# Patient Record
Sex: Female | Born: 1995
Health system: Southern US, Community
[De-identification: ages and names within clinical notes are randomized; demographics above are authoritative.]

## PROBLEM LIST (undated history)

## (undated) DIAGNOSIS — A749 Chlamydial infection, unspecified: Secondary | ICD-10-CM

## (undated) DIAGNOSIS — A599 Trichomoniasis, unspecified: Secondary | ICD-10-CM

---
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
## 2016-01-30 ENCOUNTER — Ambulatory Visit (INDEPENDENT_AMBULATORY_CARE_PROVIDER_SITE_OTHER): Payer: Medicaid Other | Admitting: Obstetrics

## 2016-01-30 ENCOUNTER — Encounter: Payer: Self-pay | Admitting: Obstetrics

## 2016-01-30 VITALS — BP 128/82 | HR 95 | Temp 98.6°F | Ht 59.0 in | Wt 157.0 lb

## 2016-01-30 DIAGNOSIS — Z01419 Encounter for gynecological examination (general) (routine) without abnormal findings: Secondary | ICD-10-CM | POA: Diagnosis not present

## 2016-01-30 DIAGNOSIS — Z0000 Encounter for general adult medical examination without abnormal findings: Secondary | ICD-10-CM

## 2016-01-30 DIAGNOSIS — Z975 Presence of (intrauterine) contraceptive device: Secondary | ICD-10-CM

## 2016-01-30 DIAGNOSIS — Z113 Encounter for screening for infections with a predominantly sexual mode of transmission: Secondary | ICD-10-CM

## 2016-01-30 MED ORDER — VITAFOL-NANO 18-0.6-0.4 MG PO TABS
1.0000 | ORAL_TABLET | Freq: Every day | ORAL | Status: DC
Start: 2016-01-30 — End: 2016-11-29

## 2016-01-31 ENCOUNTER — Encounter: Payer: Self-pay | Admitting: Obstetrics

## 2016-01-31 LAB — HIV ANTIBODY (ROUTINE TESTING W REFLEX): HIV Screen 4th Generation wRfx: NONREACTIVE

## 2016-01-31 LAB — HEPATITIS B SURFACE ANTIGEN: HEP B S AG: NEGATIVE

## 2016-01-31 LAB — RPR: RPR: NONREACTIVE

## 2016-01-31 LAB — HEPATITIS C ANTIBODY: Hep C Virus Ab: 0.1 s/co ratio (ref 0.0–0.9)

## 2016-01-31 NOTE — Progress Notes (Signed)
Subjective:        Elizabeth Coleman is a 20 y.o. female here for a routine exam.  Current complaints: none.    Personal health questionnaire:  Is patient [name], have a family history of breast and/or ovarian cancer: no Is there a family history of uterine cancer diagnosed at age < 1350, gastrointestinal cancer, urinary tract cancer, family member who is a Personnel officerLynch syndrome-associated carrier: no Is the patient overweight and hypertensive, family history of diabetes, personal history of gestational diabetes, preeclampsia or PCOS: no Is patient over 7255, have PCOS,  family history of premature CHD under age 19, diabetes, smoke, have hypertension or peripheral artery disease:  no At any time, has a partner hit, kicked or otherwise hurt or frightened you?: no Over the past 2 weeks, have you felt down, depressed or hopeless?: no Over the past 2 weeks, have you felt little interest or pleasure in doing things?:no   Gynecologic History No LMP recorded. Patient has had an implant. Contraception: Nexplanon Last Pap: n/a. Results were: n/a Last mammogram: n/a. Results were: n/a  Obstetric History OB History  Gravida Para Term Preterm AB SAB TAB Ectopic Multiple Living  0 0 0 0 0 0 0 0 0 0         Past Medical History  Diagnosis Date  . Chlamydia   . Trichomonal infection     History reviewed. No pertinent past surgical history.   Current outpatient prescriptions:  .  etonogestrel (NEXPLANON) 68 MG IMPL implant, 1 each by Subdermal route once., Disp: , Rfl:  .  Prenatal-Fe Fum-Methf-FA w/o A (VITAFOL-NANO) 18-0.6-0.4 MG TABS, Take 1 tablet by mouth daily before breakfast., Disp: 30 tablet, Rfl: 11 No Known Allergies  Social History  Substance Use Topics  . Smoking status: Light Tobacco Smoker -- 0.25 packs/day    Types: Cigarettes  . Smokeless tobacco: Never Used     Comment: trying to stop   . Alcohol Use: No    Family History  Problem Relation Age of Onset  .  Hypertension Other   . Diabetes Other   . Cancer Other   . Heart disease Maternal Grandmother   . Cancer Maternal Grandfather       Review of Systems  Constitutional: negative for fatigue and weight loss Respiratory: negative for cough and wheezing Cardiovascular: negative for chest pain, fatigue and palpitations Gastrointestinal: negative for abdominal pain and change in bowel habits Musculoskeletal:negative for myalgias Neurological: negative for gait problems and tremors Behavioral/Psych: negative for abusive relationship, depression Endocrine: negative for temperature intolerance   Genitourinary:negative for abnormal menstrual periods, genital lesions, hot flashes, sexual problems and vaginal discharge Integument/breast: negative for breast lump, breast tenderness, nipple discharge and skin lesion(s)    Objective:       BP 128/82 mmHg  Pulse 95  Temp(Src) 98.6 F (37 C)  Ht 4\' 11"  (1.499 m)  Wt 157 lb (71.215 kg)  BMI 31.69 kg/m2 General:   alert  Skin:   no rash or abnormalities  Lungs:   clear to auscultation bilaterally  Heart:   regular rate and rhythm, S1, S2 normal, no murmur, click, rub or gallop  Breasts:   normal without suspicious masses, skin or nipple changes or axillary nodes  Abdomen:  normal findings: no organomegaly, soft, non-tender and no hernia  Pelvis:  External genitalia: normal general appearance Urinary system: urethral meatus normal and bladder without fullness, nontender Vaginal: normal without tenderness, induration or masses Cervix: normal appearance Adnexa: normal bimanual  exam Uterus: anteverted and non-tender, normal size   Lab Review Urine pregnancy test Labs reviewed yes Radiologic studies reviewed no    Assessment:    Healthy female exam.    Plan:    Education reviewed: depression evaluation, low fat, low cholesterol diet, safe sex/STD prevention and weight bearing exercise. Contraception: condoms and Nexplanon. Follow  up in: 1 year.   Meds ordered this encounter  Medications  . etonogestrel (NEXPLANON) 68 MG IMPL implant    Sig: 1 each by Subdermal route once.  . Prenatal-Fe Fum-Methf-FA w/o A (VITAFOL-NANO) 18-0.6-0.4 MG TABS    Sig: Take 1 tablet by mouth daily before breakfast.    Dispense:  30 tablet    Refill:  11   Orders Placed This Encounter  Procedures  . HIV antibody  . Hepatitis B surface antigen  . RPR  . Hepatitis C antibody  . NuSwab Vaginitis Plus (VG+)

## 2016-02-02 ENCOUNTER — Other Ambulatory Visit: Payer: Self-pay | Admitting: Obstetrics

## 2016-02-02 DIAGNOSIS — N760 Acute vaginitis: Principal | ICD-10-CM

## 2016-02-02 DIAGNOSIS — B9689 Other specified bacterial agents as the cause of diseases classified elsewhere: Secondary | ICD-10-CM

## 2016-02-02 MED ORDER — METRONIDAZOLE 500 MG PO TABS: 500.0000 mg | ORAL_TABLET | Freq: Two times a day (BID) | ORAL | Status: DC

## 2016-02-05 LAB — NUSWAB VAGINITIS PLUS (VG+)
ATOPOBIUM VAGINAE: HIGH {score} — AB
BVAB 2: HIGH {score} — AB
CHLAMYDIA TRACHOMATIS, NAA: NEGATIVE
Candida albicans, NAA: NEGATIVE
Candida glabrata, NAA: NEGATIVE
MEGASPHAERA 1: HIGH {score} — AB
NEISSERIA GONORRHOEAE, NAA: NEGATIVE
Trich vag by NAA: NEGATIVE

## 2016-02-06 ENCOUNTER — Encounter (HOSPITAL_COMMUNITY): Payer: Self-pay

## 2016-02-06 ENCOUNTER — Other Ambulatory Visit: Payer: Self-pay | Admitting: Obstetrics

## 2016-02-06 ENCOUNTER — Emergency Department (HOSPITAL_COMMUNITY)
Admission: EM | Admit: 2016-02-06 | Discharge: 2016-02-06 | Disposition: A | Discharge status: Home / Self Care | Payer: Medicaid Other | Attending: Emergency Medicine | Admitting: Emergency Medicine

## 2016-02-06 DIAGNOSIS — L02415 Cutaneous abscess of right lower limb: Secondary | ICD-10-CM | POA: Diagnosis not present

## 2016-02-06 DIAGNOSIS — Z79899 Other long term (current) drug therapy: Secondary | ICD-10-CM | POA: Diagnosis not present

## 2016-02-06 DIAGNOSIS — Z8619 Personal history of other infectious and parasitic diseases: Secondary | ICD-10-CM | POA: Diagnosis not present

## 2016-02-06 DIAGNOSIS — F17210 Nicotine dependence, cigarettes, uncomplicated: Secondary | ICD-10-CM | POA: Insufficient documentation

## 2016-02-06 DIAGNOSIS — Z792 Long term (current) use of antibiotics: Secondary | ICD-10-CM | POA: Insufficient documentation

## 2016-02-06 DIAGNOSIS — L089 Local infection of the skin and subcutaneous tissue, unspecified: Secondary | ICD-10-CM | POA: Diagnosis present

## 2016-02-06 MED ORDER — SULFAMETHOXAZOLE-TRIMETHOPRIM 800-160 MG PO TABS
1.0000 | ORAL_TABLET | Freq: Once | ORAL | Status: AC
  Administered 2016-02-06: 1 via ORAL
  Filled 2016-02-06: qty 1

## 2016-02-06 MED ORDER — SULFAMETHOXAZOLE-TRIMETHOPRIM 800-160 MG PO TABS: 1.0000 | ORAL_TABLET | Freq: Two times a day (BID) | ORAL | Status: AC

## 2016-02-06 NOTE — ED Notes (Signed)
Pt complains of an abcess on her right inner thigh that she noticed Sunday, it's not draining yet and is as big as a dime

## 2016-02-06 NOTE — Discharge Instructions (Signed)
Please read and follow all provided instructions.  Your diagnoses today include:  1. Abscess of right thigh     Tests performed today include:  Vital signs. See below for your results today.   Medications prescribed:   Bactrim (trimethoprim/sulfamethoxazole) - antibiotic  You have been prescribed an antibiotic medicine: take the entire course of medicine even if you are feeling better. Stopping early can cause the antibiotic not to work.  Take any prescribed medications only as directed.   Home care instructions:   Follow any educational materials contained in this packet  Follow-up instructions: Return to the Emergency Department in 48 hours for a recheck if your symptoms are not significantly improved.  Please follow-up with your primary care provider in the next 1 week for further evaluation of your symptoms.   Return instructions:  Return to the Emergency Department if you have:  Fever  Worsening symptoms  Worsening pain  Worsening swelling  Redness of the skin that moves away from the affected area, especially if it streaks away from the affected area   Any other emergent concerns   Your vital signs today were: BP 99/64 mmHg   Pulse 70   Temp(Src) 98.3 F (36.8 C) (Oral)   Resp 18   Ht 4\' 11"  (1.499 m)   Wt 70.308 kg   BMI 31.29 kg/m2   SpO2 100% If your blood pressure (BP) was elevated above 135/85 this visit, please have this repeated by your doctor within one month. --------------

## 2016-02-06 NOTE — ED Provider Notes (Signed)
CSN: [id number]     Arrival date & time 02/06/16  0213 History   First MD Initiated Contact with Patient 02/06/16 [phone]     Chief Complaint  Patient presents with  . Recurrent Skin Infections     (Consider location/radiation/quality/duration/timing/severity/associated sxs/prior Treatment) HPI Comments: Patient with history of skin infection presents with complaint of abscess to the right thigh first noticed 2 days ago. Area is mildly tender. It has not been draining. No fevers, nausea or vomiting. Patient has never needed I&D in the past and has been treated with oral antibiotics only. No history of diabetes or immunocompromise. The onset of this condition was acute. The course is constant. Aggravating factors: none. Alleviating factors: none.    The history is provided by the patient.    Past Medical History  Diagnosis Date  . Chlamydia   . Trichomonal infection    History reviewed. No pertinent past surgical history. Family History  Problem Relation Age of Onset  . Hypertension Other   . Diabetes Other   . Cancer Other   . Heart disease Maternal Grandmother   . Cancer Maternal Grandfather    Social History  Substance Use Topics  . Smoking status: Light Tobacco Smoker -- 0.25 packs/day    Types: Cigarettes  . Smokeless tobacco: Never Used     Comment: trying to stop   . Alcohol Use: No   OB History    Gravida Para Term Preterm AB TAB SAB Ectopic Multiple Living       Review of Systems  Constitutional: Negative for fever.  Gastrointestinal: Negative for nausea and vomiting.  Skin: Negative for color change.       Positive for abscess.  Hematological: Negative for adenopathy.      Allergies  Review of patient's allergies indicates no known allergies.  Home Medications   Prior to Admission medications   Medication Sig Start Date End Date Taking? Authorizing Provider  etonogestrel (NEXPLANON) 68 MG IMPL implant 1 each by Subdermal route  once.   Yes Historical Provider, MD  metroNIDAZOLE (FLAGYL) 500 MG tablet Take 1 tablet (500 mg total) by mouth 2 (two) times daily. 02/02/16  Yes Brock Bad, MD  Prenatal-Fe Fum-Methf-FA w/o A (VITAFOL-NANO) 18-0.6-0.4 MG TABS Take 1 tablet by mouth daily before breakfast. 01/30/16   Brock Bad, MD   BP 99/64 mmHg  Pulse 70  Temp(Src) 98.3 F (36.8 C) (Oral)  Resp 18  Ht  (1.499 m)  Wt 70.308 kg  BMI 31.29 kg/m2  SpO2 100% Physical Exam  Constitutional: She appears well-developed and well-nourished.  HENT:  Head: Normocephalic and atraumatic.  Eyes: Conjunctivae are normal.  Neck: Normal range of motion. Neck supple.  Pulmonary/Chest: No respiratory distress.  Neurological: She is alert.  Skin: Skin is warm and dry.  Small pustule without significant area of fluctuance or induration to the right inner aspect of the thigh. No surrounding cellulitis.  Psychiatric: She has a normal mood and affect.  Nursing note and vitals reviewed.   ED Course  Procedures (including critical care time) Labs Review Labs Reviewed - No data to display  Imaging Review No results found. I have personally reviewed and evaluated these images and lab results as part of my medical decision-making.   EKG Interpretation None       4:49 AM Patient seen and examined. Medications ordered.   Vital signs reviewed and are as follows: BP 99/64 mmHg  Pulse 70  Temp(Src) 98.3 F (36.8 C) (Oral)  Resp 18  Ht 4\' 11"  (1.499 m)  Wt 70.308 kg  BMI 31.29 kg/m2  SpO2 100%  The patient was urged to return to the Emergency Department urgently with worsening pain, swelling, expanding erythema especially if it streaks away from the affected area, fever, or if they have any other concerns.   The patient was urged to return to the Emergency Department or go to their PCP in 48 hours for wound recheck if the area is not significantly improved.  The patient verbalized understanding and stated  agreement with this plan.     MDM   Final diagnoses:  Abscess of right thigh   Patient with non-drainable small pustules/abscess of the right inner thigh. Will give trial of oral antibiotics. Patient is to return with worsening pain, redness, swelling or fever.    Renne CriglerJoshua Korynn Kenedy, PA-C 02/06/16 [id number]  Laurence Spatesachel Morgan Little, MD 02/09/16 1021

## 2016-08-24 ENCOUNTER — Emergency Department (HOSPITAL_COMMUNITY)
Admission: EM | Admit: 2016-08-24 | Discharge: 2016-08-24 | Disposition: A | Discharge status: Home / Self Care | Payer: Medicaid Other | Attending: Emergency Medicine | Admitting: Emergency Medicine

## 2016-08-24 ENCOUNTER — Encounter (HOSPITAL_COMMUNITY): Payer: Self-pay | Admitting: Emergency Medicine

## 2016-08-24 DIAGNOSIS — R197 Diarrhea, unspecified: Secondary | ICD-10-CM

## 2016-08-24 DIAGNOSIS — F17210 Nicotine dependence, cigarettes, uncomplicated: Secondary | ICD-10-CM | POA: Insufficient documentation

## 2016-08-24 NOTE — ED Triage Notes (Addendum)
Per pt, states diarrhea that started today, no abdominal pain-states asymptomatic-only had a couple of loose stools-doesn't know if she ate something bad

## 2016-08-24 NOTE — Discharge Instructions (Signed)
Clear liquids as tolerated for the next 12 hours, then slowly advance diet to normal.  Return to the emergency department if you develop bloody stools, high fevers, severe abdominal pain, or other new and concerning symptoms.

## 2016-08-26 NOTE — ED Provider Notes (Signed)
[facility]-EMERGENCY DEPT Provider Note   CSN: [id number] Arrival date & time: 08/24/16  1835     History   Chief Complaint Chief Complaint  Patient presents with  . Diarrhea    HPI Elizabeth Coleman is a 20 y.o. female.  Patient is a 20 year old female who presents with complaints of diarrhea. She has had multiple episodes of this since yesterday. All been nonbloody and nonbilious. She reports one episode of vomiting yesterday. She denies any fevers or chills. She denies any significant abdominal pain.   The history is provided by the patient.  Diarrhea   This is a new problem. The current episode started less than 1 hour ago. The problem has not changed since onset.There has been no fever. She has tried nothing for the symptoms. The treatment provided no relief.    Past Medical History:  Diagnosis Date  . Chlamydia   . Trichomonal infection     Patient Active Problem List   Diagnosis Date Noted  . Chlamydia trachomatis infection of lower genitourinary sites 01/05/2014  . Screening examination for venereal disease 10/28/2013  . History of chlamydia infection 09/01/2013  . Urinary tract infection, site not specified 07/08/2013  . Vaginitis and vulvovaginitis, unspecified 07/08/2013    History reviewed. No pertinent surgical history.  OB History    Gravida Para Term Preterm AB Living   0 0 0 0 0 0   SAB TAB Ectopic Multiple Live Births   0 0 0 0         Home Medications    Prior to Admission medications   Medication Sig Start Date End Date Taking? Authorizing Provider  etonogestrel (NEXPLANON) 68 MG IMPL implant 1 each by Subdermal route once.    Historical Provider, MD  metroNIDAZOLE (FLAGYL) 500 MG tablet Take 1 tablet (500 mg total) by mouth 2 (two) times daily. 02/02/16   Brock Badharles A Harper, MD  Prenatal-Fe Fum-Methf-FA w/o A (VITAFOL-NANO) 18-0.6-0.4 MG TABS Take 1 tablet by mouth daily before breakfast. 01/30/16   Brock Badharles A Harper, MD    Family History Family  History  Problem Relation Age of Onset  . Heart disease Maternal Grandmother   . Cancer Maternal Grandfather   . Hypertension Other   . Diabetes Other   . Cancer Other     Social History Social History  Substance Use Topics  . Smoking status: Light Tobacco Smoker    Packs/day: 0.25    Types: Cigarettes  . Smokeless tobacco: Never Used     Comment: trying to stop   . Alcohol use No     Allergies   Patient has no known allergies.   Review of Systems Review of Systems  Gastrointestinal: Positive for diarrhea.  All other systems reviewed and are negative.    Physical Exam Updated Vital Signs BP 126/78   Pulse 76   Temp 97.7 F (36.5 C) (Oral)   Resp 17   SpO2 97%   Physical Exam  Constitutional: She is oriented to person, place, and time. She appears well-developed and well-nourished. No distress.  HENT:  Head: Normocephalic and atraumatic.  Neck: Normal range of motion. Neck supple.  Cardiovascular: Normal rate and regular rhythm.  Exam reveals no gallop and no friction rub.   No murmur heard. Pulmonary/Chest: Effort normal and breath sounds normal. No respiratory distress. She has no wheezes.  Abdominal: Soft. Bowel sounds are normal. She exhibits no distension. There is no tenderness.  Musculoskeletal: Normal range of motion.  Neurological: She is alert  and oriented to person, place, and time.  Skin: Skin is warm and dry. She is not diaphoretic.  Nursing note and vitals reviewed.    ED Treatments / Results  Labs (all labs ordered are listed, but only abnormal results are displayed) Labs Reviewed - No data to display  EKG  EKG Interpretation None       Radiology No results found.  Procedures Procedures (including critical care time)  Medications Ordered in ED Medications - No data to display   Initial Impression / Assessment and Plan / ED Course  I have reviewed the triage vital signs and the nursing notes.  Pertinent labs & imaging  results that were available during my care of the patient were reviewed by me and considered in my medical decision making (see chart for details).  Clinical Course     Symptoms likely viral in nature. Will discharge with clear liquids, and when necessary return.  Final Clinical Impressions(s) / ED Diagnoses   Final diagnoses:  Diarrhea, unspecified type    New Prescriptions Discharge Medication List as of 08/24/2016  9:25 PM       Geoffery Lyonsouglas Constance Whittle, MD 08/26/16 [phone]

## 2016-09-11 ENCOUNTER — Ambulatory Visit: Payer: Medicaid Other | Admitting: Obstetrics

## 2016-09-29 ENCOUNTER — Emergency Department (HOSPITAL_COMMUNITY)
Admission: EM | Admit: 2016-09-29 | Discharge: 2016-09-29 | Disposition: A | Discharge status: Home / Self Care | Payer: No Typology Code available for payment source | Attending: Emergency Medicine | Admitting: Emergency Medicine

## 2016-09-29 ENCOUNTER — Emergency Department (HOSPITAL_COMMUNITY): Payer: No Typology Code available for payment source

## 2016-09-29 ENCOUNTER — Encounter (HOSPITAL_COMMUNITY): Payer: Self-pay | Admitting: *Deleted

## 2016-09-29 DIAGNOSIS — S46912A Strain of unspecified muscle, fascia and tendon at shoulder and upper arm level, left arm, initial encounter: Secondary | ICD-10-CM | POA: Insufficient documentation

## 2016-09-29 DIAGNOSIS — S39012A Strain of muscle, fascia and tendon of lower back, initial encounter: Secondary | ICD-10-CM | POA: Diagnosis not present

## 2016-09-29 DIAGNOSIS — Y92410 Unspecified street and highway as the place of occurrence of the external cause: Secondary | ICD-10-CM | POA: Insufficient documentation

## 2016-09-29 DIAGNOSIS — F17210 Nicotine dependence, cigarettes, uncomplicated: Secondary | ICD-10-CM | POA: Insufficient documentation

## 2016-09-29 DIAGNOSIS — Y999 Unspecified external cause status: Secondary | ICD-10-CM | POA: Insufficient documentation

## 2016-09-29 DIAGNOSIS — Y939 Activity, unspecified: Secondary | ICD-10-CM | POA: Diagnosis not present

## 2016-09-29 DIAGNOSIS — S4992XA Unspecified injury of left shoulder and upper arm, initial encounter: Secondary | ICD-10-CM | POA: Diagnosis present

## 2016-09-29 MED ORDER — METHOCARBAMOL 500 MG PO TABS: 500.0000 mg | ORAL_TABLET | Freq: Three times a day (TID) | ORAL | 0 refills | Status: DC | PRN

## 2016-09-29 MED ORDER — NAPROXEN 500 MG PO TABS: 500.0000 mg | ORAL_TABLET | Freq: Two times a day (BID) | ORAL | 0 refills | Status: DC

## 2016-09-29 MED ORDER — IBUPROFEN 800 MG PO TABS
800.0000 mg | ORAL_TABLET | Freq: Once | ORAL | Status: AC
  Administered 2016-09-29: 800 mg via ORAL
  Filled 2016-09-29: qty 1
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
## 2016-09-29 NOTE — Discharge Instructions (Signed)
Ice to painful areas.  Rx for Naproxen (anti-inflammatory), and Robaxin (muscle relaxant).
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
## 2016-11-23 ENCOUNTER — Ambulatory Visit (HOSPITAL_COMMUNITY)
Admission: EM | Admit: 2016-11-23 | Discharge: 2016-11-23 | Disposition: A | Discharge status: Home / Self Care | Payer: Self-pay | Attending: Radiology | Admitting: Radiology

## 2016-11-23 ENCOUNTER — Encounter (HOSPITAL_COMMUNITY): Payer: Self-pay | Admitting: *Deleted

## 2016-11-23 DIAGNOSIS — R1084 Generalized abdominal pain: Secondary | ICD-10-CM

## 2016-11-23 MED ORDER — DICYCLOMINE HCL 10 MG PO CAPS: 10.0000 mg | ORAL_CAPSULE | Freq: Three times a day (TID) | ORAL | 0 refills | Status: DC

## 2016-11-23 NOTE — ED Provider Notes (Signed)
CSN: [id number]     Arrival date & time 11/23/16  1343 History   None    Chief Complaint  Patient presents with  . Abdominal Pain   (Consider location/radiation/quality/duration/timing/severity/associated sxs/prior Treatment) 21 y.o. female presents with  Self -resolving stomach cramps X 2 days. Patient states that she ate some food on Friday that she thinks was bad. Patient reports diarrhea but denies nausea, vomiting or fevers. Patient states that some else she ate with is suffering from similar signs and symptoms. Condition is acute in nature. Condition is made better by nothing. Condition is made worse by nothing. Patient denies any treatment  prior to there arrival at this facility.        Past Medical History:  Diagnosis Date  . Chlamydia   . Trichomonal infection    History reviewed. No pertinent surgical history. Family History  Problem Relation Age of Onset  . Heart disease Maternal Grandmother   . Cancer Maternal Grandfather   . Hypertension Other   . Diabetes Other   . Cancer Other    Social History  Substance Use Topics  . Smoking status: Light Tobacco Smoker    Packs/day: 0.25    Types: Cigarettes  . Smokeless tobacco: Never Used     Comment: trying to stop   . Alcohol use No   OB History    Gravida Para Term Preterm AB Living   0 0 0 0 0 0   SAB TAB Ectopic Multiple Live Births   0 0 0 0       Review of Systems  Constitutional: Negative for chills and fever.  HENT: Negative for ear pain and sore throat.   Eyes: Negative for pain and visual disturbance.  Respiratory: Negative for cough and shortness of breath.   Cardiovascular: Negative for chest pain and palpitations.  Gastrointestinal: Positive for abdominal distention, abdominal pain and diarrhea. Negative for nausea. Constipation:  self resolved.  Genitourinary: Negative for dysuria and hematuria.  Musculoskeletal: Negative for arthralgias and back pain.  Skin: Negative for color change and rash.   Neurological: Negative for seizures and syncope.  All other systems reviewed and are negative.   Allergies  Patient has no known allergies.  Home Medications   Prior to Admission medications   Medication Sig Start Date End Date Taking? Authorizing Provider  etonogestrel (NEXPLANON) 68 MG IMPL implant 1 each by Subdermal route once.   Yes Historical Provider, MD  dicyclomine (BENTYL) 10 MG capsule Take 1 capsule (10 mg total) by mouth 4 (four) times daily -  before meals and at bedtime. 11/23/16 11/26/16  Alene MiresJennifer C Omohundro, NP  methocarbamol (ROBAXIN) 500 MG tablet Take 1 tablet (500 mg total) by mouth 3 (three) times daily between meals as needed. 09/29/16   Rolland PorterMark James, MD  metroNIDAZOLE (FLAGYL) 500 MG tablet Take 1 tablet (500 mg total) by mouth 2 (two) times daily. Patient not taking: Reported on 09/29/2016 02/02/16   Brock Badharles A Harper, MD  naproxen (NAPROSYN) 500 MG tablet Take 1 tablet (500 mg total) by mouth 2 (two) times daily. 09/29/16   Rolland PorterMark James, MD  Prenatal-Fe Fum-Methf-FA w/o A (VITAFOL-NANO) 18-0.6-0.4 MG TABS Take 1 tablet by mouth daily before breakfast. Patient not taking: Reported on 09/29/2016 01/30/16   Brock Badharles A Harper, MD   Meds Ordered and Administered this Visit  Medications - No data to display  BP 121/69   Pulse 73   Temp 98.3 F (36.8 C)   Resp 16   SpO2 100%  No data found.   Physical Exam  Constitutional: She is oriented to person, place, and time. She appears well-developed and well-nourished.  HENT:  Head: Normocephalic and atraumatic.  Eyes: Conjunctivae are normal.  Neck: Normal range of motion.  Cardiovascular: Normal rate and regular rhythm.   Pulmonary/Chest: Effort normal and breath sounds normal.  Abdominal: Soft. Distention:  rlq. There is tenderness.  Neurological: She is alert and oriented to person, place, and time.  Psychiatric: She has a normal mood and affect.  Nursing note and vitals reviewed.   Urgent Care Course      Procedures (including critical care time)  Labs Review Labs Reviewed - No data to display  Imaging Review No results found.          MDM   1. Generalized abdominal pain        Alene Mires, NP 11/23/16 1647

## 2016-11-23 NOTE — ED Triage Notes (Signed)
C/O nausea and diarrhea since yesterday.  Friend got same sxs after eating same food.  Nausea and diarrhea better now, but continues with intermittent abd cramping.  Denies fevers.

## 2017-01-30 ENCOUNTER — Ambulatory Visit: Payer: Self-pay | Admitting: Obstetrics

## 2017-04-11 ENCOUNTER — Encounter (HOSPITAL_COMMUNITY): Payer: Self-pay | Admitting: Emergency Medicine

## 2017-04-11 ENCOUNTER — Emergency Department (HOSPITAL_COMMUNITY)
Admission: EM | Admit: 2017-04-11 | Discharge: 2017-04-11 | Disposition: A | Discharge status: Home / Self Care | Payer: Medicaid Other | Attending: Emergency Medicine | Admitting: Emergency Medicine

## 2017-04-11 DIAGNOSIS — B9789 Other viral agents as the cause of diseases classified elsewhere: Secondary | ICD-10-CM

## 2017-04-11 DIAGNOSIS — J029 Acute pharyngitis, unspecified: Secondary | ICD-10-CM

## 2017-04-11 DIAGNOSIS — R0981 Nasal congestion: Secondary | ICD-10-CM

## 2017-04-11 DIAGNOSIS — F17210 Nicotine dependence, cigarettes, uncomplicated: Secondary | ICD-10-CM | POA: Insufficient documentation

## 2017-04-11 DIAGNOSIS — J069 Acute upper respiratory infection, unspecified: Secondary | ICD-10-CM | POA: Insufficient documentation

## 2017-04-11 MED ORDER — GUAIFENESIN 100 MG/5ML PO LIQD: 100.0000 mg | ORAL | 0 refills | Status: DC | PRN

## 2017-04-11 MED ORDER — OXYMETAZOLINE HCL 0.05 % NA SOLN: 2.0000 | Freq: Two times a day (BID) | NASAL | 0 refills | Status: AC

## 2017-04-11 MED ORDER — GUAIFENESIN-DM 100-10 MG/5ML PO SYRP
5.0000 mL | ORAL_SOLUTION | ORAL | 0 refills | Status: DC | PRN
Start: 2017-04-11 — End: 2018-11-11

## 2017-04-11 MED ORDER — OXYMETAZOLINE HCL 0.05 % NA SOLN: 2.0000 | Freq: Two times a day (BID) | NASAL | 0 refills | Status: DC

## 2017-04-11 NOTE — Discharge Instructions (Signed)
Use Afrin nasal spray for nasal congestion, but do not use for more than 3 days in a row because this can cause urinary nasal congestion to get worse. You may use warm water salt gargles, warm teas, throat lozenges, or over-the-counter sprays for your sore throat. You may use ibuprofen or Tylenol as needed for muscle aches or fever or headache. Return to the ED if any concerning signs or symptoms develop. Follow up with a primary care physician if you are not better in the next 3-4 days.

## 2017-04-11 NOTE — ED Triage Notes (Signed)
Pt complaint of sore throat and nasal congestion; yellow output.

## 2017-04-11 NOTE — ED Provider Notes (Signed)
[facility]-EMERGENCY DEPT Provider Note   CSN: [id number] Arrival date & time: 04/11/17  1655  By signing my name below, I, Cynda Acres, attest that this documentation has been prepared under the direction and in the presence of [facility], PA-C. Electronically Signed: Cynda Acres, Scribe. 04/11/17. 5:32 PM.  History   Chief Complaint Chief Complaint  Patient presents with  . Nasal Congestion  . Sore Throat   HPI Comments: Elizabeth Coleman is a 21 y.o. female with no pertinent past medical history, who presents to the Emergency Department complaining of a gradual-onset, persistent Nasal congestion with associated mild sore throat that began two days ago. Patient has been around her niece who is experiencing similar symptoms. Patient reports associated sneezing, intermittently productive cough with yellow sputum, and intermittent mild frontal headache. She denies chest congestion or shortness of breath. Patient reports taking tylenol, Theraflu, and aleve without significant relief.  No aggravating or alleviating factors noted. Patient is able to tolerate secretions and fluids. Patient denies any fever, chills, chest pain,  abdominal pain, nausea, or vomiting.    The history is provided by the patient. No language interpreter was used.    Past Medical History:  Diagnosis Date  . Chlamydia   . Trichomonal infection     Patient Active Problem List   Diagnosis Date Noted  . Chlamydia trachomatis infection of lower genitourinary sites 01/05/2014  . Screening examination for venereal disease 10/28/2013  . History of chlamydia infection 09/01/2013  . Urinary tract infection, site not specified 07/08/2013  . Vaginitis and vulvovaginitis, unspecified 07/08/2013    History reviewed. No pertinent surgical history.  OB History    Gravida Para Term Preterm AB Living   0 0 0 0 0 0   SAB TAB Ectopic Multiple Live Births   0 0 0 0         Home Medications    Prior to Admission  medications   Medication Sig Start Date End Date Taking? Authorizing Provider  dicyclomine (BENTYL) 10 MG capsule Take 1 capsule (10 mg total) by mouth 4 (four) times daily -  before meals and at bedtime. 11/23/16 11/26/16  Alene Mires, NP  etonogestrel (NEXPLANON) 68 MG IMPL implant 1 each by Subdermal route once.    [provider]  guaiFENesin-dextromethorphan (ROBITUSSIN DM) 100-10 MG/5ML syrup Take 5 mLs by mouth every 4 (four) hours as needed for cough. 04/11/17   Luevenia Maxin, Shayona Hibbitts A, PA-C  oxymetazoline (AFRIN NASAL SPRAY) 0.05 % nasal spray Place 2 sprays into both nostrils 2 (two) times daily. 04/11/17 04/13/17  Jeanie Sewer, PA-C    Family History Family History  Problem Relation Age of Onset  . Heart disease Maternal Grandmother   . Cancer Maternal Grandfather   . Hypertension Other   . Diabetes Other   . Cancer Other     Social History Social History  Substance Use Topics  . Smoking status: Light Tobacco Smoker    Packs/day: 0.25    Types: Cigarettes  . Smokeless tobacco: Never Used     Comment: trying to stop   . Alcohol use No     Allergies   Patient has no known allergies.   Review of Systems Review of Systems  Constitutional: Negative for chills and fever.  HENT: Positive for congestion, sneezing and sore throat. Negative for trouble swallowing.   Respiratory: Positive for cough. Negative for shortness of breath.   Cardiovascular: Negative for chest pain.  Gastrointestinal: Negative for abdominal pain, nausea and  vomiting.  Neurological: Positive for headaches.     Physical Exam Updated Vital Signs BP 133/82 (BP Location: Left Arm)   Pulse 88   Temp 98.7 F (37.1 C) (Oral)   Resp 20   SpO2 100%   Physical Exam  Constitutional: She appears well-developed and well-nourished.  HENT:  Head: Normocephalic and atraumatic.  Right Ear: External ear normal.  Left Ear: External ear normal.  Mouth/Throat: Oropharynx is clear and moist. No  oropharyngeal exudate.  No frontal or maxillary sinus TTP. Nasal septum is midline with pale pink mucosa and nasal congestion present. Posterior oropharynx with mild erythema. No uvalar deviation, tonsillar hypertrophy, or exudates. TMs normal bilaterally  Eyes: EOM are normal. Pupils are equal, round, and reactive to light. Right eye exhibits no discharge. Left eye exhibits no discharge.  Neck: Normal range of motion. Neck supple. No JVD present. No tracheal deviation present.  Cardiovascular: Normal rate and regular rhythm.   Pulmonary/Chest: Effort normal and breath sounds normal. She has no wheezes. She has no rales. She exhibits no tenderness.  Abdominal: Soft. Bowel sounds are normal. She exhibits no distension. There is no tenderness.  Musculoskeletal: Normal range of motion. She exhibits no edema.  Lymphadenopathy:    She has no cervical adenopathy.  Neurological: She is alert.  Fluent speech, no facial droop  Skin: Skin is warm and dry.  Psychiatric: She has a normal mood and affect.  Nursing note and vitals reviewed.    ED Treatments / Results  DIAGNOSTIC STUDIES: Oxygen Saturation is 100% on RA, normal by my interpretation.    COORDINATION OF CARE: 5:31 PM Discussed treatment plan with pt at bedside and pt agreed to plan, which includes tylenol, ibuprofen, afrin, and tea with honey.   Labs (all labs ordered are listed, but only abnormal results are displayed) Labs Reviewed - No data to display  EKG  EKG Interpretation None       Radiology No results found.  Procedures Procedures (including critical care time)  Medications Ordered in ED Medications - No data to display   Initial Impression / Assessment and Plan / ED Course  I have reviewed the triage vital signs and the nursing notes.  Pertinent labs & imaging results that were available during my care of the patient were reviewed by me and considered in my medical decision making (see chart for  details).      Patients symptoms are consistent with URI, likely viral etiology. Afebrile, vital signs are stable, SPO2 100% on room air. Lungs clear to auscultation. Low suspicion of pneumonia, bronchitis, or pulmonary etiology to symptoms. Chest x-ray not indicated at this time. Discussed that antibiotics are not indicated for viral infections. Pt will be discharged with symptomatic treatment of Afrin (counseled patient on appropriate use) and Mucinex-DM .  Verbalizes understanding and is agreeable with plan. Pt is hemodynamically stable & in NAD prior to dc.   Final Clinical Impressions(s) / ED Diagnoses   Final diagnoses:  Viral URI with cough  Sore throat  Nasal congestion    New Prescriptions Discharge Medication List as of 04/11/2017  5:41 PM    START taking these medications   Details  guaiFENesin-dextromethorphan (ROBITUSSIN DM) 100-10 MG/5ML syrup Take 5 mLs by mouth every 4 (four) hours as needed for cough., Starting Fri 04/11/2017, Print    guaiFENesin (ROBITUSSIN) 100 MG/5ML liquid Take 5-10 mLs (100-200 mg total) by mouth every 4 (four) hours as needed for cough., Starting Fri 04/11/2017, Print  I personally performed the services described in this documentation, which was scribed in my presence. The recorded information has been reviewed and is accurate.     Jeanie SewerFawze, Jozalynn Noyce A, PA-C 04/11/17 1915    Vanetta MuldersZackowski, Scott, MD 04/13/17 [phone]

## 2017-04-24 ENCOUNTER — Other Ambulatory Visit (HOSPITAL_COMMUNITY)
Admission: RE | Admit: 2017-04-24 | Discharge: 2017-04-24 | Disposition: A | Discharge status: Home / Self Care | Payer: Medicaid Other | Source: Ambulatory Visit | Attending: Obstetrics | Admitting: Obstetrics

## 2017-04-24 ENCOUNTER — Ambulatory Visit (INDEPENDENT_AMBULATORY_CARE_PROVIDER_SITE_OTHER): Payer: Medicaid Other | Admitting: Obstetrics

## 2017-04-24 ENCOUNTER — Encounter: Payer: Self-pay | Admitting: Obstetrics

## 2017-04-24 VITALS — BP 122/80 | HR 92 | Ht 59.0 in | Wt 143.2 lb

## 2017-04-24 DIAGNOSIS — N898 Other specified noninflammatory disorders of vagina: Secondary | ICD-10-CM

## 2017-04-24 DIAGNOSIS — Z01419 Encounter for gynecological examination (general) (routine) without abnormal findings: Secondary | ICD-10-CM | POA: Diagnosis not present

## 2017-04-24 DIAGNOSIS — Z309 Encounter for contraceptive management, unspecified: Secondary | ICD-10-CM | POA: Diagnosis not present

## 2017-04-24 DIAGNOSIS — Z3046 Encounter for surveillance of implantable subdermal contraceptive: Secondary | ICD-10-CM

## 2017-04-24 NOTE — Progress Notes (Signed)
Subjective:        Elizabeth Coleman is a 21 y.o. female here for a routine exam.  Current complaints: None.    Personal health questionnaire:  Is patient [name], have a family history of breast and/or ovarian cancer: no Is there a family history of uterine cancer diagnosed at age < 17, gastrointestinal cancer, urinary tract cancer, family member who is a Personnel officer syndrome-associated carrier: no Is the patient overweight and hypertensive, family history of diabetes, personal history of gestational diabetes, preeclampsia or PCOS: no Is patient over 76, have PCOS,  family history of premature CHD under age 34, diabetes, smoke, have hypertension or peripheral artery disease:  no At any time, has a partner hit, kicked or otherwise hurt or frightened you?: no Over the past 2 weeks, have you felt down, depressed or hopeless?: no Over the past 2 weeks, have you felt little interest or pleasure in doing things?:no   Gynecologic History No LMP recorded. Patient has had an implant. Contraception: Nexplanon Last Pap: n/a. Results were: n/a Last mammogram: n/a. Results were: n/a  Obstetric History OB History  Gravida Para Term Preterm AB Living  0 0 0 0 0 0  SAB TAB Ectopic Multiple Live Births  0 0 0 0          Past Medical History:  Diagnosis Date  . Chlamydia   . Trichomonal infection     History reviewed. No pertinent surgical history.   Current Outpatient Prescriptions:  .  etonogestrel (NEXPLANON) 68 MG IMPL implant, 1 each by Subdermal route once., Disp: , Rfl:  .  dicyclomine (BENTYL) 10 MG capsule, Take 1 capsule (10 mg total) by mouth 4 (four) times daily -  before meals and at bedtime., Disp: 12 capsule, Rfl: 0 .  guaiFENesin-dextromethorphan (ROBITUSSIN DM) 100-10 MG/5ML syrup, Take 5 mLs by mouth every 4 (four) hours as needed for cough. (Patient not taking: Reported on 04/24/2017), Disp: 118 mL, Rfl: 0 No Known Allergies  Social History  Substance Use Topics   . Smoking status: Light Tobacco Smoker    Packs/day: 0.25    Types: Cigarettes  . Smokeless tobacco: Never Used     Comment: trying to stop   . Alcohol use No    Family History  Problem Relation Age of Onset  . Heart disease Maternal Grandmother   . Cancer Maternal Grandfather   . Hypertension Other   . Diabetes Other   . Cancer Other       Review of Systems  Constitutional: negative for fatigue and weight loss Respiratory: negative for cough and wheezing Cardiovascular: negative for chest pain, fatigue and palpitations Gastrointestinal: negative for abdominal pain and change in bowel habits Musculoskeletal:negative for myalgias Neurological: negative for gait problems and tremors Behavioral/Psych: negative for abusive relationship, depression Endocrine: negative for temperature intolerance    Genitourinary:negative for abnormal menstrual periods, genital lesions, hot flashes, sexual problems and vaginal discharge Integument/breast: negative for breast lump, breast tenderness, nipple discharge and skin lesion(s)    Objective:       BP 122/80   Pulse 92   Ht 4\' 11"  (1.499 m)   Wt 143 lb 3.2 oz (65 kg)   BMI 28.92 kg/m  General:   alert  Skin:   no rash or abnormalities  Lungs:   clear to auscultation bilaterally  Heart:   regular rate and rhythm, S1, S2 normal, no murmur, click, rub or gallop  Breasts:   normal without suspicious masses, skin or  nipple changes or axillary nodes  Abdomen:  normal findings: no organomegaly, soft, non-tender and no hernia  Pelvis:  External genitalia: normal general appearance Urinary system: urethral meatus normal and bladder without fullness, nontender Vaginal: normal without tenderness, induration or masses Cervix: normal appearance Adnexa: normal bimanual exam Uterus: anteverted and non-tender, normal size   Lab Review Urine pregnancy test Labs reviewed yes Radiologic studies reviewed no  50% of 20 min visit spent on  counseling and coordination of care.    Assessment:    Healthy female exam.    Contraceptive Surveillance.  Pleased with Nexplanon.   Plan:    Education reviewed: calcium supplements, depression evaluation, low fat, low cholesterol diet, safe sex/STD prevention, self breast exams, smoking cessation and weight bearing exercise. Contraception: Nexplanon. Follow up in: 1 year.   No orders of the defined types were placed in this encounter.  No orders of the defined types were placed in this encounter.   Patient ID: Elizabeth Coleman, female   DOB: 1995/11/19, 21 y.o.   MRN: [id number]

## 2017-04-24 NOTE — Progress Notes (Signed)
Patient is in the office for annual exam. Pt has questions about bleeding with nexplanon.

## 2017-04-25 LAB — CERVICOVAGINAL ANCILLARY ONLY
CHLAMYDIA, DNA PROBE: NEGATIVE
Neisseria Gonorrhea: NEGATIVE

## 2017-05-03 ENCOUNTER — Encounter (HOSPITAL_COMMUNITY): Payer: Self-pay | Admitting: Emergency Medicine

## 2017-05-03 ENCOUNTER — Emergency Department (HOSPITAL_COMMUNITY)
Admission: EM | Admit: 2017-05-03 | Discharge: 2017-05-03 | Disposition: A | Discharge status: Home / Self Care | Payer: Medicaid Other | Attending: Emergency Medicine | Admitting: Emergency Medicine

## 2017-05-03 DIAGNOSIS — S0990XA Unspecified injury of head, initial encounter: Secondary | ICD-10-CM

## 2017-05-03 DIAGNOSIS — Y9389 Activity, other specified: Secondary | ICD-10-CM | POA: Insufficient documentation

## 2017-05-03 DIAGNOSIS — T148XXA Other injury of unspecified body region, initial encounter: Secondary | ICD-10-CM

## 2017-05-03 DIAGNOSIS — Y929 Unspecified place or not applicable: Secondary | ICD-10-CM | POA: Insufficient documentation

## 2017-05-03 DIAGNOSIS — W228XXA Striking against or struck by other objects, initial encounter: Secondary | ICD-10-CM | POA: Insufficient documentation

## 2017-05-03 DIAGNOSIS — F17210 Nicotine dependence, cigarettes, uncomplicated: Secondary | ICD-10-CM | POA: Insufficient documentation

## 2017-05-03 DIAGNOSIS — Y999 Unspecified external cause status: Secondary | ICD-10-CM | POA: Insufficient documentation

## 2017-05-03 DIAGNOSIS — Z79899 Other long term (current) drug therapy: Secondary | ICD-10-CM | POA: Insufficient documentation

## 2017-05-03 DIAGNOSIS — S098XXA Other specified injuries of head, initial encounter: Secondary | ICD-10-CM | POA: Insufficient documentation

## 2017-05-03 LAB — POC URINE PREG, ED: Preg Test, Ur: NEGATIVE

## 2017-05-03 MED ORDER — TETANUS-DIPHTH-ACELL PERTUSSIS 5-2.5-18.5 LF-MCG/0.5 IM SUSP
0.5000 mL | Freq: Once | INTRAMUSCULAR | Status: AC
  Administered 2017-05-03: 0.5 mL via INTRAMUSCULAR
  Filled 2017-05-03: qty 0.5

## 2017-05-03 NOTE — ED Notes (Signed)
ED Provider at bedside. 
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
## 2017-05-03 NOTE — ED Notes (Signed)
Patient left at this time with all belongings. 

## 2017-05-03 NOTE — ED Notes (Signed)
Refusing wound cleaning, states she would rather do it herself as she is worried it will hurt. Instructed on wound care at home, provided telfa and tape to use at home.
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
## 2017-05-03 NOTE — ED Notes (Signed)
Police at bedside ..

## 2017-11-10 ENCOUNTER — Other Ambulatory Visit (HOSPITAL_COMMUNITY)
Admission: RE | Admit: 2017-11-10 | Discharge: 2017-11-10 | Disposition: A | Discharge status: Home / Self Care | Payer: Medicaid Other | Source: Ambulatory Visit | Attending: Obstetrics | Admitting: Obstetrics

## 2017-11-10 ENCOUNTER — Ambulatory Visit (INDEPENDENT_AMBULATORY_CARE_PROVIDER_SITE_OTHER): Payer: Medicaid Other | Admitting: Obstetrics

## 2017-11-10 ENCOUNTER — Encounter: Payer: Self-pay | Admitting: Obstetrics

## 2017-11-10 VITALS — BP 125/84 | HR 77 | Wt 140.0 lb

## 2017-11-10 DIAGNOSIS — N898 Other specified noninflammatory disorders of vagina: Secondary | ICD-10-CM | POA: Diagnosis not present

## 2017-11-10 DIAGNOSIS — Z113 Encounter for screening for infections with a predominantly sexual mode of transmission: Secondary | ICD-10-CM

## 2017-11-10 NOTE — Progress Notes (Signed)
Onset: 3 weeks ago per pt  Color:None   Thick:NO Itching:None   Thin: YES Burning:None  Odor:NO RU:[id number]/[id number]:spotting/bleeding with Nexplanon. Inserted 09/26/15  Last AEX:01/30/2016

## 2017-11-10 NOTE — Progress Notes (Signed)
Patient ID: Elizabeth Coleman, female   DOB: December 29, 1995, 22 y.o.   MRN: [id number]  No chief complaint on file.   HPI Elizabeth Coleman is a 22 y.o. female.  Vaginal discharge.  Denies odor or irritation. HPI  Past Medical History:  Diagnosis Date  . Chlamydia   . Trichomonal infection     History reviewed. No pertinent surgical history.  Family History  Problem Relation Age of Onset  . Heart disease Maternal Grandmother   . Cancer Maternal Grandfather   . Hypertension Other   . Diabetes Other   . Cancer Other     Social History Social History   Tobacco Use  . Smoking status: Light Tobacco Smoker    Packs/day: 0.25    Types: Cigarettes  . Smokeless tobacco: Never Used  . Tobacco comment: trying to stop   Substance Use Topics  . Alcohol use: No    Alcohol/week: 0.0 oz  . Drug use: No    No Known Allergies  Current Outpatient Medications  Medication Sig Dispense Refill  . etonogestrel (NEXPLANON) 68 MG IMPL implant 1 each by Subdermal route once.    . dicyclomine (BENTYL) 10 MG capsule Take 1 capsule (10 mg total) by mouth 4 (four) times daily -  before meals and at bedtime. 12 capsule 0  . guaiFENesin-dextromethorphan (ROBITUSSIN DM) 100-10 MG/5ML syrup Take 5 mLs by mouth every 4 (four) hours as needed for cough. (Patient not taking: Reported on 04/24/2017) 118 mL 0   No current facility-administered medications for this visit.     Review of Systems Review of Systems Constitutional: negative for fatigue and weight loss Respiratory: negative for cough and wheezing Cardiovascular: negative for chest pain, fatigue and palpitations Gastrointestinal: negative for abdominal pain and change in bowel habits Genitourinary:positive for vaginal discharge Integument/breast: negative for nipple discharge Musculoskeletal:negative for myalgias Neurological: negative for gait problems and tremors Behavioral/Psych: negative for abusive relationship, depression Endocrine:  negative for temperature intolerance      Blood pressure 125/84, pulse 77, weight 140 lb (63.5 kg).  Physical Exam Physical Exam           General:  Alert and no distress Abdomen:  normal findings: no organomegaly, soft, non-tender and no hernia  Pelvis:  External genitalia: normal general appearance Urinary system: urethral meatus normal and bladder without fullness, nontender Vaginal: normal without tenderness, induration or masses Cervix: normal appearance Adnexa: normal bimanual exam Uterus: anteverted and non-tender, normal size    50% of 15 min visit spent on counseling and coordination of care.   Data Reviewed Labs  Assessment     1. Vaginal discharge Rx: - Cervicovaginal ancillary only - Hepatitis B surface antigen - Hepatitis C antibody - HIV antibody - RPR  2. Screen for STD (sexually transmitted disease)     Plan    Follow up prn  Orders Placed This Encounter  Procedures  . Hepatitis B surface antigen  . Hepatitis C antibody  . HIV antibody  . RPR   No orders of the defined types were placed in this encounter.

## 2017-11-11 ENCOUNTER — Other Ambulatory Visit: Payer: Self-pay | Admitting: Obstetrics

## 2017-11-11 DIAGNOSIS — N760 Acute vaginitis: Principal | ICD-10-CM

## 2017-11-11 DIAGNOSIS — A5901 Trichomonal vulvovaginitis: Secondary | ICD-10-CM

## 2017-11-11 DIAGNOSIS — B9689 Other specified bacterial agents as the cause of diseases classified elsewhere: Secondary | ICD-10-CM

## 2017-11-11 LAB — RPR: RPR Ser Ql: NONREACTIVE

## 2017-11-11 LAB — HEPATITIS B SURFACE ANTIGEN: Hepatitis B Surface Ag: NEGATIVE

## 2017-11-11 LAB — CERVICOVAGINAL ANCILLARY ONLY
Bacterial vaginitis: POSITIVE — AB
CANDIDA VAGINITIS: NEGATIVE
CHLAMYDIA, DNA PROBE: NEGATIVE
Neisseria Gonorrhea: NEGATIVE
Trichomonas: POSITIVE — AB

## 2017-11-11 LAB — HIV ANTIBODY (ROUTINE TESTING W REFLEX): HIV Screen 4th Generation wRfx: NONREACTIVE

## 2017-11-11 LAB — HEPATITIS C ANTIBODY

## 2017-11-11 MED ORDER — TINIDAZOLE 500 MG PO TABS: 2.0000 g | ORAL_TABLET | Freq: Every day | ORAL | 0 refills | Status: DC

## 2018-04-04 ENCOUNTER — Emergency Department (HOSPITAL_COMMUNITY): Payer: PRIVATE HEALTH INSURANCE

## 2018-04-04 ENCOUNTER — Encounter (HOSPITAL_COMMUNITY): Payer: Self-pay | Admitting: Emergency Medicine

## 2018-04-04 ENCOUNTER — Emergency Department (HOSPITAL_COMMUNITY)
Admission: EM | Admit: 2018-04-04 | Discharge: 2018-04-04 | Disposition: A | Discharge status: Home / Self Care | Payer: PRIVATE HEALTH INSURANCE | Attending: Emergency Medicine | Admitting: Emergency Medicine

## 2018-04-04 DIAGNOSIS — Z23 Encounter for immunization: Secondary | ICD-10-CM | POA: Insufficient documentation

## 2018-04-04 DIAGNOSIS — F17210 Nicotine dependence, cigarettes, uncomplicated: Secondary | ICD-10-CM | POA: Diagnosis not present

## 2018-04-04 DIAGNOSIS — Y990 Civilian activity done for income or pay: Secondary | ICD-10-CM | POA: Diagnosis not present

## 2018-04-04 DIAGNOSIS — W290XXA Contact with powered kitchen appliance, initial encounter: Secondary | ICD-10-CM | POA: Diagnosis not present

## 2018-04-04 DIAGNOSIS — S61209A Unspecified open wound of unspecified finger without damage to nail, initial encounter: Secondary | ICD-10-CM

## 2018-04-04 DIAGNOSIS — S61101A Unspecified open wound of right thumb with damage to nail, initial encounter: Secondary | ICD-10-CM | POA: Diagnosis not present

## 2018-04-04 DIAGNOSIS — Y9259 Other trade areas as the place of occurrence of the external cause: Secondary | ICD-10-CM | POA: Diagnosis not present

## 2018-04-04 DIAGNOSIS — Z79899 Other long term (current) drug therapy: Secondary | ICD-10-CM | POA: Diagnosis not present

## 2018-04-04 DIAGNOSIS — S6991XA Unspecified injury of right wrist, hand and finger(s), initial encounter: Secondary | ICD-10-CM | POA: Diagnosis present

## 2018-04-04 DIAGNOSIS — Y93G1 Activity, food preparation and clean up: Secondary | ICD-10-CM | POA: Insufficient documentation

## 2018-04-04 MED ORDER — LIDOCAINE-EPINEPHRINE (PF) 2 %-1:200000 IJ SOLN: 10.0000 mL | Freq: Once | INTRAMUSCULAR | Status: DC

## 2018-04-04 MED ORDER — LIDOCAINE HCL (PF) 1 % IJ SOLN
10.0000 mL | Freq: Once | INTRAMUSCULAR | Status: DC
  Filled 2018-04-04: qty 10

## 2018-04-04 MED ORDER — TETANUS-DIPHTH-ACELL PERTUSSIS 5-2.5-18.5 LF-MCG/0.5 IM SUSP
0.5000 mL | Freq: Once | INTRAMUSCULAR | Status: AC
  Administered 2018-04-04: 0.5 mL via INTRAMUSCULAR
  Filled 2018-04-04: qty 0.5

## 2018-04-04 MED ORDER — CEPHALEXIN 500 MG PO CAPS: 500.0000 mg | ORAL_CAPSULE | Freq: Four times a day (QID) | ORAL | 0 refills | Status: DC

## 2018-04-04 MED ORDER — OXYCODONE-ACETAMINOPHEN 5-325 MG PO TABS
1.0000 | ORAL_TABLET | Freq: Once | ORAL | Status: AC
  Administered 2018-04-04: 1 via ORAL
  Filled 2018-04-04: qty 1

## 2018-04-04 MED ORDER — OXYCODONE-ACETAMINOPHEN 5-325 MG PO TABS: 1.0000 | ORAL_TABLET | Freq: Four times a day (QID) | ORAL | 0 refills | Status: DC | PRN

## 2018-04-04 MED ORDER — LIDOCAINE HCL (PF) 1 % IJ SOLN
5.0000 mL | Freq: Once | INTRAMUSCULAR | Status: AC
  Administered 2018-04-04: 5 mL

## 2018-04-04 NOTE — ED Notes (Signed)
PA Provider at bedside. 

## 2018-04-04 NOTE — Discharge Instructions (Signed)
Please take Ibuprofen (Advil, motrin) and Tylenol (acetaminophen) to relieve your pain.  You may take up to 600 MG (3 pills) of normal strength ibuprofen every 8 hours as needed.  In between doses of ibuprofen you make take tylenol, up to 1,000 mg (two extra strength pills).  Do not take more than 3,000 mg tylenol in a 24 hour period.  Please check all medication labels as many medications such as pain and cold medications may contain tylenol.  Do not drink alcohol while taking these medications.  Do not take other NSAID'S while taking ibuprofen (such as aleve or naproxen).  Please take ibuprofen with food to decrease stomach upset. ° °Today you received medications that may make you sleepy or impair your ability to make decisions.  For the next 24 hours please do not drive, operate heavy machinery, care for a small child with out another adult present, or perform any activities that may cause harm to you or someone else if you were to fall asleep or be impaired.  ° °You are being prescribed a medication which may make you sleepy. Please follow up of listed precautions for at least 24 hours after taking one dose. ° °You may have diarrhea from the antibiotics.  It is very important that you continue to take the antibiotics even if you get diarrhea unless a medical professional tells you that you may stop taking them.  If you stop too early the bacteria you are being treated for will become stronger and you may need different, more powerful antibiotics that have more side effects and worsening diarrhea.  Please stay well hydrated and consider probiotics as they may decrease the severity of your diarrhea.  Please be aware that if you take any hormonal contraception (birth control pills, nexplanon, the ring, etc) that your birth control will not work while you are taking antibiotics and you need to use back up protection as directed on the birth control medication information insert.  ° °

## 2018-04-04 NOTE — ED Notes (Signed)
Patient transported to X-ray 

## 2018-04-04 NOTE — ED Triage Notes (Signed)
Pt reports she was at work using a Administrator, artsmeat slicer and reports accidentally slicing her right thumbnail off. Last tetanus unknown.

## 2018-04-04 NOTE — ED Provider Notes (Signed)
[facility] EMERGENCY DEPARTMENT Provider Note   CSN: [id number] Arrival date & time: 04/04/18  7829     History   Chief Complaint Chief Complaint  Patient presents with  . Finger Injury    HPI Elizabeth Coleman is a 22 y.o. female with no significant past medical history who presents today for evaluation of a right thumb injury.  She reports that she works at [employer] in [employer] section and was using a Administrator, arts when she caught her right thumbnail off.  She reports that this happened at around 7 this morning.  She is unsure when her last tetanus shot was.  She reports generalized pain in the area.  She denies any numbness or tingling.  HPI  Past Medical History:  Diagnosis Date  . Chlamydia   . Trichomonal infection     Patient Active Problem List   Diagnosis Date Noted  . Chlamydia trachomatis infection of lower genitourinary sites 01/05/2014  . Screening examination for venereal disease 10/28/2013  . History of chlamydia infection 09/01/2013  . Urinary tract infection, site not specified 07/08/2013  . Vaginitis and vulvovaginitis, unspecified 07/08/2013    History reviewed. No pertinent surgical history.   OB History    Gravida  0   Para  0   Term  0   Preterm  0   AB  0   Living  0     SAB  0   TAB  0   Ectopic  0   Multiple  0   Live Births               Home Medications    Prior to Admission medications   Medication Sig Start Date End Date Taking? Authorizing Provider  cephALEXin (KEFLEX) 500 MG capsule Take 1 capsule (500 mg total) by mouth 4 (four) times daily. 04/04/18   Cristina Gong, PA-C  dicyclomine (BENTYL) 10 MG capsule Take 1 capsule (10 mg total) by mouth 4 (four) times daily -  before meals and at bedtime. 11/23/16 11/26/16  Alene Mires, NP  etonogestrel (NEXPLANON) 68 MG IMPL implant 1 each by Subdermal route once.    [provider]  guaiFENesin-dextromethorphan (ROBITUSSIN DM)  100-10 MG/5ML syrup Take 5 mLs by mouth every 4 (four) hours as needed for cough. Patient not taking: Reported on 04/24/2017 04/11/17   Michela Pitcher A, PA-C  oxyCODONE-acetaminophen (PERCOCET/ROXICET) 5-325 MG tablet Take 1 tablet by mouth every 6 (six) hours as needed for severe pain. 04/04/18   Cristina Gong, PA-C  tinidazole [facility]) 500 MG tablet Take 4 tablets (2,000 mg total) by mouth daily with breakfast. 11/11/17   Brock Bad, MD    Family History Family History  Problem Relation Age of Onset  . Heart disease Maternal Grandmother   . Cancer Maternal Grandfather   . Hypertension Other   . Diabetes Other   . Cancer Other     Social History Social History   Tobacco Use  . Smoking status: Light Tobacco Smoker    Packs/day: 0.25    Types: Cigarettes  . Smokeless tobacco: Never Used  . Tobacco comment: trying to stop   Substance Use Topics  . Alcohol use: No    Alcohol/week: 0.0 oz  . Drug use: No    Frequency: 1.0 times per week    Types: Marijuana     Allergies   Patient has no known allergies.   Review of Systems Review of Systems  Musculoskeletal:       Pain in right thumb  Skin: Positive for wound.  Neurological: Negative for weakness.     Physical Exam Updated Vital Signs BP 122/84 (BP Location: Right Arm)   Pulse 80   Temp 98.3 F (36.8 C) (Oral)   Resp 20   SpO2 100%   Physical Exam  Constitutional: She appears well-developed and well-nourished.  HENT:  Head: Normocephalic.  Cardiovascular: Intact distal pulses.  Distal tip of right thumb has brisk capillary refill.  Musculoskeletal:  Patient has full range of motion of right thumb.    Neurological:  Sensation intact to right distal thumb.  Skin: She is not diaphoretic.  The middle part of the right nail is fully avulsed.  There is nail fragments left on each side and the lateral nail folds, these are firmly attached.   There is mild bleeding from the nailbed.  Wound is  superficial, does not extend near bone or deeper tissues.  Psychiatric: She has a normal mood and affect. Her behavior is normal.  Nursing note and vitals reviewed.    ED Treatments / Results  Labs (all labs ordered are listed, but only abnormal results are displayed) Labs Reviewed - No data to display  EKG None  Radiology Dg Finger Thumb Right  Result Date: 04/04/2018 CLINICAL DATA:  Right thumb laceration injury. Pt states she cut the nail completely off using a meat slicer at her job this morning. No hx of right thumb injuries or surgeries. EXAM: RIGHT THUMB 2+V COMPARISON:  None. FINDINGS: No fracture.  The joints are normally spaced and aligned. No radiopaque foreign body. IMPRESSION: No fracture, dislocation or radiopaque foreign body Electronically Signed   By: Amie Portland M.D.   On: 04/04/2018 10:28    Procedures Irrigation Date/Time: 04/04/2018 11:45 AM Performed by: Cristina Gong, PA-C Authorized by: Cristina Gong, PA-C  Consent: Verbal consent obtained. Risks and benefits: risks, benefits and alternatives were discussed Consent given by: patient Local anesthesia used: yes Anesthesia method: Topical.  Anesthesia: Local anesthesia used: yes Local Anesthetic: lidocaine 1% without epinephrine Patient tolerance: Patient tolerated the procedure well with no immediate complications Comments: Anesthesia options were discussed with patient including no anesthesia, topical application of lidocaine, and complete digital block.  She elected for topical application.     (including critical care time)  Medications Ordered in ED Medications  oxyCODONE-acetaminophen (PERCOCET/ROXICET) 5-325 MG per tablet 1 tablet (1 tablet Oral Given 04/04/18 1120)  Tdap (BOOSTRIX) injection 0.5 mL (0.5 mLs Intramuscular Given 04/04/18 1120)  lidocaine (PF) (XYLOCAINE) 1 % injection 5 mL (5 mLs Infiltration Given by Other 04/04/18 1119)     Initial Impression / Assessment and  Plan / ED Course  I have reviewed the triage vital signs and the nursing notes.  Pertinent labs & imaging results that were available during my care of the patient were reviewed by me and considered in my medical decision making (see chart for details).    Patient presents today for evaluation of a wound on her right thumb after a accident at work where she avulsed the middle part of her right thumbnail.  Scant bleeding.  There is no repairable wound.  The area was irrigated and cleaned after it was partially anesthetized with topical lidocaine.  Tdap updated.  She is given prescription for antibiotics and pain medicine in addition to instructions on generalized wound care and over-the-counter pain medicine usage.  She is given follow-up with hand surgery and work note.  Return in 2 days for wound recheck or sooner if complications or concerns.  X-rays did not show fracture.  Patient discharged home.  Final Clinical Impressions(s) / ED Diagnoses   Final diagnoses:  Fingertip avulsion, initial encounter    ED Discharge Orders        Ordered    cephALEXin (KEFLEX) 500 MG capsule  4 times daily     04/04/18 1137    oxyCODONE-acetaminophen (PERCOCET/ROXICET) 5-325 MG tablet  Every 6 hours PRN     04/04/18 1137       Cristina GongHammond, Devonne Kitchen W, [place] JerseyPA-C 04/04/18 1148    Margarita Grizzleay, Danielle, MD 04/04/18 1524

## 2018-04-06 ENCOUNTER — Encounter (HOSPITAL_COMMUNITY): Payer: Self-pay | Admitting: Emergency Medicine

## 2018-04-06 ENCOUNTER — Emergency Department (HOSPITAL_COMMUNITY)
Admission: EM | Admit: 2018-04-06 | Discharge: 2018-04-06 | Disposition: A | Discharge status: Home / Self Care | Payer: Self-pay | Attending: Emergency Medicine | Admitting: Emergency Medicine

## 2018-04-06 ENCOUNTER — Other Ambulatory Visit: Payer: Self-pay

## 2018-04-06 DIAGNOSIS — Z5189 Encounter for other specified aftercare: Secondary | ICD-10-CM | POA: Insufficient documentation

## 2018-04-06 DIAGNOSIS — F17210 Nicotine dependence, cigarettes, uncomplicated: Secondary | ICD-10-CM | POA: Insufficient documentation

## 2018-04-06 DIAGNOSIS — S61209D Unspecified open wound of unspecified finger without damage to nail, subsequent encounter: Secondary | ICD-10-CM | POA: Insufficient documentation

## 2018-04-06 DIAGNOSIS — Z79899 Other long term (current) drug therapy: Secondary | ICD-10-CM | POA: Insufficient documentation

## 2018-04-06 DIAGNOSIS — X58XXXD Exposure to other specified factors, subsequent encounter: Secondary | ICD-10-CM | POA: Insufficient documentation

## 2018-04-06 DIAGNOSIS — F1210 Cannabis abuse, uncomplicated: Secondary | ICD-10-CM | POA: Insufficient documentation

## 2018-04-06 NOTE — ED Provider Notes (Signed)
[facility]   CSN: [id number] Arrival date & time: 04/06/18  1248     History   Chief Complaint Chief Complaint  Patient presents with  . Follow-up    HPI Elizabeth Coleman is a 22 y.o. female.  Elizabeth Coleman is a 22 y.o. Female otherwise healthy, presents to the emergency department for a wound check.  She was seen in the emergency department 2 days ago after cutting her right thumbnail off at work.  Patient reports she has been taking her antibiotics as directed and using pain meds as needed.  She denies any increasing pain, no worsening swelling, redness or any drainage.  She has been changing the dressing regularly and keeping the area clean and dry.  She reports some mild stiffness but is able to bend the thumb without difficulty.     Past Medical History:  Diagnosis Date  . Chlamydia   . Trichomonal infection     Patient Active Problem List   Diagnosis Date Noted  . Chlamydia trachomatis infection of lower genitourinary sites 01/05/2014  . Screening examination for venereal disease 10/28/2013  . History of chlamydia infection 09/01/2013  . Urinary tract infection, site not specified 07/08/2013  . Vaginitis and vulvovaginitis, unspecified 07/08/2013    History reviewed. No pertinent surgical history.   OB History    Gravida  0   Para  0   Term  0   Preterm  0   AB  0   Living  0     SAB  0   TAB  0   Ectopic  0   Multiple  0   Live Births               Home Medications    Prior to Admission medications   Medication Sig Start Date End Date Taking? Authorizing Provider  cephALEXin (KEFLEX) 500 MG capsule Take 1 capsule (500 mg total) by mouth 4 (four) times daily. 04/04/18   Cristina GongHammond, Elizabeth W, PA-C  dicyclomine (BENTYL) 10 MG capsule Take 1 capsule (10 mg total) by mouth 4 (four) times daily -  before meals and at bedtime. 11/23/16 11/26/16  Alene Miresmohundro, Jennifer C, NP  etonogestrel  (NEXPLANON) 68 MG IMPL implant 1 each by Subdermal route once.    [provider]  guaiFENesin-dextromethorphan (ROBITUSSIN DM) 100-10 MG/5ML syrup Take 5 mLs by mouth every 4 (four) hours as needed for cough. Patient not taking: Reported on 04/24/2017 04/11/17   Michela PitcherFawze, Mina A, PA-C  oxyCODONE-acetaminophen (PERCOCET/ROXICET) 5-325 MG tablet Take 1 tablet by mouth every 6 (six) hours as needed for severe pain. 04/04/18   Cristina GongHammond, Elizabeth W, PA-C  tinidazole [facility](TINDAMAX) 500 MG tablet Take 4 tablets (2,000 mg total) by mouth daily with breakfast. 11/11/17   Brock BadHarper, Charles A, MD    Family History Family History  Problem Relation Age of Onset  . Heart disease Maternal Grandmother   . Cancer Maternal Grandfather   . Hypertension Other   . Diabetes Other   . Cancer Other     Social History Social History   Tobacco Use  . Smoking status: Light Tobacco Smoker    Packs/day: 0.25    Types: Cigarettes  . Smokeless tobacco: Never Used  . Tobacco comment: trying to stop   Substance Use Topics  . Alcohol use: No    Alcohol/week: 0.0 oz  . Drug use: No    Frequency: 1.0 times per week    Types:  Marijuana     Allergies   Patient has no known allergies.   Review of Systems Review of Systems  Constitutional: Negative for chills and fever.  Skin: Positive for wound. Negative for color change and rash.  Neurological: Negative for weakness and numbness.     Physical Exam Updated Vital Signs BP 106/80 (BP Location: Right Arm)   Pulse 84   Temp 98.3 F (36.8 C) (Oral)   Resp 16   SpO2 100%   Physical Exam  Constitutional: She appears well-developed and well-nourished. No distress.  HENT:  Head: Normocephalic and atraumatic.  Eyes: Right eye exhibits no discharge. Left eye exhibits no discharge.  Pulmonary/Chest: Effort normal. No respiratory distress.  Musculoskeletal:  Wound check of right thumb injury (please see photo below) wound appears to be healing well, no  active bleeding, no appreciable swelling, or redness, no drainage, normal range of motion at the IP joint and MCP joint, sensation intact, good capillary refill and 2+ radial pulse.  Neurological: She is alert. Coordination normal.  Skin: She is not diaphoretic.  Psychiatric: She has a normal mood and affect. Her behavior is normal.  Nursing note and vitals reviewed.      ED Treatments / Results  Labs (all labs ordered are listed, but only abnormal results are displayed) Labs Reviewed - No data to display  EKG None  Radiology No results found.  Procedures Procedures (including critical care time)  Medications Ordered in ED Medications - No data to display   Initial Impression / Assessment and Plan / ED Course  I have reviewed the triage vital signs and the nursing notes.  Pertinent labs & imaging results that were available during my care of the patient were reviewed by me and considered in my medical decision making (see chart for details).  Patient presents for wound check.  No increasing pain, redness, swelling or drainage.  Patient taking antibiotics as directed, changing dressing daily and keeping wound clean.  Wound appears to be healing well, I imagine this will take a while to heal as there was not much to repair.  Thumb is neurovascularly intact with good range of motion.  Encourage patient to continue with good wound care, return precautions discussed.  Patient expresses understanding and is in agreement with plan.  Wound redressed here in the ED prior to discharge.  Final Clinical Impressions(s) / ED Diagnoses   Final diagnoses:  Visit for wound check  Avulsion of fingertip, subsequent encounter    ED Discharge Orders    None       Legrand Rams 04/06/18 1743    Jacalyn Lefevre, MD 04/06/18 2311

## 2018-04-06 NOTE — Discharge Instructions (Signed)
Finger appears to be healing well.  Continue taking her antibiotics as directed.  Continue to keep area covered, clean and dry.  Monitor closely for signs of infection such as redness, swelling, increasing pain, drainage or warmth.

## 2018-04-06 NOTE — ED Triage Notes (Signed)
Pt states she cut her right thumbnail off this past Saturday. Pt here for wound recheck. Denies drainage, swelling, redness.

## 2018-04-06 NOTE — ED Notes (Signed)
Called patient for traige patient did not answer

## 2018-07-07 ENCOUNTER — Other Ambulatory Visit: Payer: Self-pay

## 2018-07-07 ENCOUNTER — Encounter (HOSPITAL_COMMUNITY): Payer: Self-pay

## 2018-07-07 ENCOUNTER — Emergency Department (HOSPITAL_COMMUNITY)
Admission: EM | Admit: 2018-07-07 | Discharge: 2018-07-07 | Disposition: A | Discharge status: Home / Self Care | Payer: Self-pay | Attending: Emergency Medicine | Admitting: Emergency Medicine

## 2018-07-07 DIAGNOSIS — Z79899 Other long term (current) drug therapy: Secondary | ICD-10-CM | POA: Insufficient documentation

## 2018-07-07 DIAGNOSIS — F17210 Nicotine dependence, cigarettes, uncomplicated: Secondary | ICD-10-CM | POA: Insufficient documentation

## 2018-07-07 DIAGNOSIS — K0889 Other specified disorders of teeth and supporting structures: Secondary | ICD-10-CM

## 2018-07-07 MED ORDER — IBUPROFEN 400 MG PO TABS
800.0000 mg | ORAL_TABLET | Freq: Once | ORAL | Status: AC
  Administered 2018-07-07: 800 mg via ORAL
  Filled 2018-07-07: qty 2

## 2018-07-07 NOTE — ED Provider Notes (Signed)
[facility] EMERGENCY DEPARTMENT Provider Note   CSN: [id number] Arrival date & time: 07/07/18  1134   History   Chief Complaint Chief Complaint  Patient presents with  . Dental Pain    HPI Elizabeth Coleman is a 22 y.o. female.  HPI C/o tooth pain to 32.  It has been happening for a few weeks.  She has been trying to take some "head/cold syrup" for pain relief and it helps but does not last long enough per her.  She has no insurance and has been trying to get some to go see a dentist.  She sometimes hurts bad enough that she does not want to chew but firmly denies ever having concern to physically pass food or breath.  She denies excessive drooling or any throat swelling or fever.   Past Medical History:  Diagnosis Date  . Chlamydia   . Trichomonal infection     Patient Active Problem List   Diagnosis Date Noted  . Chlamydia trachomatis infection of lower genitourinary sites 01/05/2014  . Screening examination for venereal disease 10/28/2013  . History of chlamydia infection 09/01/2013  . Urinary tract infection, site not specified 07/08/2013  . Vaginitis and vulvovaginitis, unspecified 07/08/2013    History reviewed. No pertinent surgical history.   OB History    Gravida  0   Para  0   Term  0   Preterm  0   AB  0   Living  0     SAB  0   TAB  0   Ectopic  0   Multiple  0   Live Births               Home Medications    Prior to Admission medications   Medication Sig Start Date End Date Taking? Authorizing Provider  cephALEXin (KEFLEX) 500 MG capsule Take 1 capsule (500 mg total) by mouth 4 (four) times daily. 04/04/18   Cristina Gong, PA-C  dicyclomine (BENTYL) 10 MG capsule Take 1 capsule (10 mg total) by mouth 4 (four) times daily -  before meals and at bedtime. 11/23/16 11/26/16  Alene Mires, NP  etonogestrel (NEXPLANON) 68 MG IMPL implant 1 each by Subdermal route once.    [provider]    guaiFENesin-dextromethorphan (ROBITUSSIN DM) 100-10 MG/5ML syrup Take 5 mLs by mouth every 4 (four) hours as needed for cough. Patient not taking: Reported on 04/24/2017 04/11/17   Michela Pitcher A, PA-C  oxyCODONE-acetaminophen (PERCOCET/ROXICET) 5-325 MG tablet Take 1 tablet by mouth every 6 (six) hours as needed for severe pain. 04/04/18   Cristina Gong, PA-C  tinidazole [facility]) 500 MG tablet Take 4 tablets (2,000 mg total) by mouth daily with breakfast. 11/11/17   Brock Bad, MD    Family History Family History  Problem Relation Age of Onset  . Heart disease Maternal Grandmother   . Cancer Maternal Grandfather   . Hypertension Other   . Diabetes Other   . Cancer Other     Social History Social History   Tobacco Use  . Smoking status: Light Tobacco Smoker    Packs/day: 0.25    Types: Cigarettes  . Smokeless tobacco: Never Used  . Tobacco comment: trying to stop   Substance Use Topics  . Alcohol use: No    Alcohol/week: 0.0 standard drinks  . Drug use: No    Frequency: 1.0 times per week    Types: Marijuana     Allergies  Patient has no known allergies.   Review of Systems Review of Systems  Constitutional: Negative.   HENT: Positive for dental problem. Negative for drooling, ear pain, facial swelling, rhinorrhea, sinus pain, sore throat and trouble swallowing.        Thinks she has an infection in tooth 32 or problem with wisdom tooth coming in, denies throat swelling or inability to swallow  Eyes: Negative.   Respiratory: Negative.   Cardiovascular: Negative.   Gastrointestinal: Negative.   Genitourinary: Negative.   Neurological: Negative.      Physical Exam Updated Vital Signs BP 125/78 (BP Location: Right Arm)   Pulse 75   Temp 98.2 F (36.8 C) (Oral)   Resp 20   SpO2 99%   Physical Exam  Constitutional: She appears well-developed and well-nourished. No distress.  HENT:  Head: Normocephalic and atraumatic.  Nose: Nose normal.   Mouth/Throat: Oropharynx is clear and moist. No oropharyngeal exudate.  Pain does not seem to be tooth 32 (or most posterior on lower right), but likely incoming wisdom tooth posterior to last visible tooth.   There is pain with palpation there moreso than last tooth.  There is no abscess/swellnig around this.  No sublingual effects, uvula is midline.  Eyes: Conjunctivae are normal. Right eye exhibits no discharge. Left eye exhibits no discharge. No scleral icterus.  Neck: Normal range of motion. No tracheal deviation present.  Cardiovascular: Normal rate.  Pulmonary/Chest: Effort normal. No respiratory distress.  Musculoskeletal: Normal range of motion.  Lymphadenopathy:    She has no cervical adenopathy.  Neurological: She is alert. Coordination normal.  Skin: Skin is warm and dry. She is not diaphoretic.  Psychiatric: She has a normal mood and affect. Her behavior is normal. Judgment and thought content normal.     ED Treatments / Results  Labs (all labs ordered are listed, but only abnormal results are displayed) Labs Reviewed - No data to display  EKG None  Radiology No results found.  Procedures Procedures (including critical care time)  Medications Ordered in ED Medications  ibuprofen (ADVIL,MOTRIN) tablet 800 mg (800 mg Oral Given 07/07/18 1239)     Initial Impression / Assessment and Plan / ED Course  I have reviewed the triage vital signs and the nursing notes.  Pertinent labs & imaging results that were available during my care of the patient were reviewed by me and considered in my medical decision making (see chart for details).   Discussed with patient that her airway seems patent and there is no immediate concern for infection.  Over the counter pain control is appropriate for pain until reaching the definitive treatment with a dentist and that the ED does not provide routine dental services.  She is advised to return if she develops problems  eat/breathing.  Final Clinical Impressions(s) / ED Diagnoses   Final diagnoses:  Pain, dental    ED Discharge Orders    None       Marthenia RollingBland, Bejamin Hackbart, DO 07/07/18 1249    Blane OharaZavitz, Joshua, MD 07/10/18 [phone]

## 2018-07-07 NOTE — ED Triage Notes (Signed)
Pt here for dental pain 

## 2018-09-29 ENCOUNTER — Inpatient Hospital Stay (HOSPITAL_COMMUNITY)
Admission: AD | Admit: 2018-09-29 | Discharge: 2018-09-29 | Disposition: A | Discharge status: Home / Self Care | Payer: Self-pay | Source: Ambulatory Visit | Attending: Family Medicine | Admitting: Family Medicine

## 2018-09-29 ENCOUNTER — Ambulatory Visit (INDEPENDENT_AMBULATORY_CARE_PROVIDER_SITE_OTHER): Payer: Self-pay

## 2018-09-29 DIAGNOSIS — Z202 Contact with and (suspected) exposure to infections with a predominantly sexual mode of transmission: Secondary | ICD-10-CM

## 2018-09-29 DIAGNOSIS — R8290 Unspecified abnormal findings in urine: Secondary | ICD-10-CM | POA: Diagnosis not present

## 2018-09-29 DIAGNOSIS — Z113 Encounter for screening for infections with a predominantly sexual mode of transmission: Secondary | ICD-10-CM

## 2018-09-29 LAB — POCT URINALYSIS DIP (DEVICE)
Bilirubin Urine: NEGATIVE
GLUCOSE, UA: NEGATIVE mg/dL
Ketones, ur: NEGATIVE mg/dL
Nitrite: NEGATIVE
PH: 7 (ref 5.0–8.0)
PROTEIN: NEGATIVE mg/dL
Specific Gravity, Urine: 1.025 (ref 1.005–1.030)
UROBILINOGEN UA: 0.2 mg/dL (ref 0.0–1.0)

## 2018-09-29 MED ORDER — PHENAZOPYRIDINE HCL 200 MG PO TABS: 200.0000 mg | ORAL_TABLET | Freq: Three times a day (TID) | ORAL | 0 refills | Status: DC | PRN

## 2018-09-29 MED ORDER — NITROFURANTOIN MONOHYD MACRO 100 MG PO CAPS: 100.0000 mg | ORAL_CAPSULE | Freq: Two times a day (BID) | ORAL | 0 refills | Status: DC

## 2018-09-29 NOTE — MAU Provider Note (Signed)
Chief Complaint: urinary burning   First Provider Initiated Contact with Patient 09/29/18 1840      SUBJECTIVE HPI: Elizabeth Coleman is a 22 y.o. [id number] who presents to maternity admissions for possible UTI and STD screening.    Past Medical History:  Diagnosis Date  . Chlamydia   . Trichomonal infection    No past surgical history on file. Social History   Socioeconomic History  . Marital status: Single    Spouse name: Not on file  . Number of children: Not on file  . Years of education: Not on file  . Highest education level: Not on file  Occupational History  . Not on file  Social Needs  . Financial resource strain: Not on file  . Food insecurity:    Worry: Not on file    Inability: Not on file  . Transportation needs:    Medical: Not on file    Non-medical: Not on file  Tobacco Use  . Smoking status: Light Tobacco Smoker    Packs/day: 0.25    Types: Cigarettes  . Smokeless tobacco: Never Used  . Tobacco comment: trying to stop   Substance and Sexual Activity  . Alcohol use: No    Alcohol/week: 0.0 standard drinks  . Drug use: No    Frequency: 1.0 times per week    Types: Marijuana  . Sexual activity: Yes    Partners: Male    Birth control/protection: Implant  Lifestyle  . Physical activity:    Days per week: Not on file    Minutes per session: Not on file  . Stress: Not on file  Relationships  . Social connections:    Talks on phone: Not on file    Gets together: Not on file    Attends religious service: Not on file    Active member of club or organization: Not on file    Attends meetings of clubs or organizations: Not on file    Relationship status: Not on file  . Intimate partner violence:    Fear of current or ex partner: Not on file    Emotionally abused: Not on file    Physically abused: Not on file    Forced sexual activity: Not on file  Other Topics Concern  . Not on file  Social History Narrative  . Not on file   No current  facility-administered medications on file prior to encounter.    Current Outpatient Medications on File Prior to Encounter  Medication Sig Dispense Refill  . cephALEXin (KEFLEX) 500 MG capsule Take 1 capsule (500 mg total) by mouth 4 (four) times daily. 20 capsule 0  . dicyclomine (BENTYL) 10 MG capsule Take 1 capsule (10 mg total) by mouth 4 (four) times daily -  before meals and at bedtime. 12 capsule 0  . etonogestrel (NEXPLANON) 68 MG IMPL implant 1 each by Subdermal route once.    Marland Kitchen guaiFENesin-dextromethorphan (ROBITUSSIN DM) 100-10 MG/5ML syrup Take 5 mLs by mouth every 4 (four) hours as needed for cough. (Patient not taking: Reported on 04/24/2017) 118 mL 0  . oxyCODONE-acetaminophen (PERCOCET/ROXICET) 5-325 MG tablet Take 1 tablet by mouth every 6 (six) hours as needed for severe pain. 8 tablet 0  . tinidazole (TINDAMAX) 500 MG tablet Take 4 tablets (2,000 mg total) by mouth daily with breakfast. 8 tablet 0   No Known Allergies  ROS:  Review of Systems  Constitutional: Negative.  Negative for fatigue and fever.  HENT: Negative.   Respiratory: Negative.  Negative for shortness of breath.   Cardiovascular: Negative.  Negative for chest pain.  Gastrointestinal: Negative.  Negative for abdominal pain, constipation, diarrhea, nausea and vomiting.  Genitourinary: Positive for dysuria. Negative for vaginal bleeding and vaginal discharge.  Neurological: Negative.  Negative for dizziness and headaches.    I have reviewed patient's Past Medical Hx, Surgical Hx, Family Hx, Social Hx, medications and allergies.   Physical Exam   Patient Vitals for the past 24 hrs:  BP Temp Temp src Pulse Resp SpO2 Height Weight  09/29/18 1834 130/80 97.9 F (36.6 C) Oral 94 15 99 % 4\' 11"  (1.499 m) 78.5 kg   MDM Patient denies any concerning symptoms in need of emergent evaluation. Patient advised that she may wait for a room in MAU or may choose to be discharged to seek non-emergent evaluation of her  complaint at [facility] after hours [facility]. Patient desires to go to after hours clinic now.  ASSESSMENT MSE Complete  PLAN Discharge patient at her request to [facility] non-emergent [facility]  Rolm Bookbindereill, Caroline M, CNM 09/29/2018 6:40 PM

## 2018-09-29 NOTE — Progress Notes (Signed)
I have reviewed the chart and agree with nursing staff's documentation of this patient's encounter.  Thressa ShellerHeather Mayes Sangiovanni DNP, CNM  7:07 PM 09/29/18

## 2018-09-29 NOTE — MAU Note (Signed)
Pt reports burning with urination, and since she is here she wants to be checked for STD's

## 2018-09-29 NOTE — Progress Notes (Signed)
Pt here today for questionable UTI and STD screening.  Pt reports that she has burning with urination.  Urinaylsis resulted with trace of blood.  Urine cx sent.  Per protocol pt prescribed Macobid 100 mg po bid x 5 days and Pyridium 200 mg po tid x 2 days.  Pt informed that we will call with abnormal results and if we need to change antibiotic tx.  Pt stated understanding with no further questions.

## 2018-10-01 LAB — CERVICOVAGINAL ANCILLARY ONLY
Chlamydia: POSITIVE — AB
Neisseria Gonorrhea: NEGATIVE
Trichomonas: NEGATIVE

## 2018-10-01 LAB — URINE CULTURE

## 2018-10-02 ENCOUNTER — Telehealth: Payer: Self-pay

## 2018-10-02 MED ORDER — AZITHROMYCIN 250 MG PO TABS: 1000.0000 mg | ORAL_TABLET | Freq: Once | ORAL | 1 refills | Status: AC

## 2018-10-02 NOTE — Telephone Encounter (Addendum)
-----   Message from Armando ReichertHeather D Hogan, CNM sent at 10/01/2018 10:19 PM EST ----- +chlamydia. Please treat.  Notified pt of results and that we have sent an Rx for treatment for her and her partner to take per protocol.  Pt stated understanding with no further questions.

## 2018-11-11 ENCOUNTER — Ambulatory Visit (INDEPENDENT_AMBULATORY_CARE_PROVIDER_SITE_OTHER): Payer: BLUE CROSS/BLUE SHIELD | Admitting: Obstetrics

## 2018-11-11 ENCOUNTER — Encounter: Payer: Self-pay | Admitting: Obstetrics

## 2018-11-11 VITALS — BP 119/78 | HR 80 | Ht 59.0 in | Wt 174.0 lb

## 2018-11-11 DIAGNOSIS — Z01419 Encounter for gynecological examination (general) (routine) without abnormal findings: Secondary | ICD-10-CM | POA: Diagnosis not present

## 2018-11-11 DIAGNOSIS — N898 Other specified noninflammatory disorders of vagina: Secondary | ICD-10-CM

## 2018-11-11 DIAGNOSIS — Z3202 Encounter for pregnancy test, result negative: Secondary | ICD-10-CM | POA: Diagnosis not present

## 2018-11-11 DIAGNOSIS — Z124 Encounter for screening for malignant neoplasm of cervix: Secondary | ICD-10-CM

## 2018-11-11 DIAGNOSIS — Z1151 Encounter for screening for human papillomavirus (HPV): Secondary | ICD-10-CM

## 2018-11-11 DIAGNOSIS — Z113 Encounter for screening for infections with a predominantly sexual mode of transmission: Secondary | ICD-10-CM | POA: Diagnosis not present

## 2018-11-11 DIAGNOSIS — Z3046 Encounter for surveillance of implantable subdermal contraceptive: Secondary | ICD-10-CM

## 2018-11-11 DIAGNOSIS — Z87440 Personal history of urinary (tract) infections: Secondary | ICD-10-CM

## 2018-11-11 LAB — POCT URINE PREGNANCY: Preg Test, Ur: NEGATIVE

## 2018-11-11 NOTE — Addendum Note (Signed)
Addended by: Marya Landry D on: 11/11/2018 11:20 AM   Modules accepted: Orders

## 2018-11-11 NOTE — Progress Notes (Signed)
Subjective:        Elizabeth Coleman is a 23 y.o. female here for a routine exam.  Current complaints: none.    Personal health questionnaire:  Is patient [name], have a family history of breast and/or ovarian cancer: no Is there a family history of uterine cancer diagnosed at age < 5450, gastrointestinal cancer, urinary tract cancer, family member who is a Personnel officerLynch syndrome-associated carrier: no Is the patient overweight and hypertensive, family history of diabetes, personal history of gestational diabetes, preeclampsia or PCOS: no Is patient over 2155, have PCOS,  family history of premature CHD under age 22, diabetes, smoke, have hypertension or peripheral artery disease:  no At any time, has a partner hit, kicked or otherwise hurt or frightened you?: no Over the past 2 weeks, have you felt down, depressed or hopeless?: no Over the past 2 weeks, have you felt little interest or pleasure in doing things?:no   Gynecologic History No LMP recorded. Patient has had an implant. Contraception: Nexplanon Last Pap: n/a. Results were: n/a Last mammogram: n/a. Results were: n/a  Obstetric History OB History  Gravida Para Term Preterm AB Living  0 0 0 0 0 0  SAB TAB Ectopic Multiple Live Births  0 0 0 0      Past Medical History:  Diagnosis Date  . Chlamydia   . Trichomonal infection     History reviewed. No pertinent surgical history.   Current Outpatient Medications:  .  etonogestrel (NEXPLANON) 68 MG IMPL implant, 1 each by Subdermal route once., Disp: , Rfl:  No Known Allergies  Social History   Tobacco Use  . Smoking status: Light Tobacco Smoker    Packs/day: 0.25    Types: Cigarettes  . Smokeless tobacco: Never Used  . Tobacco comment: trying to stop   Substance Use Topics  . Alcohol use: Yes    Alcohol/week: 0.0 standard drinks    Comment: occ    Family History  Problem Relation Age of Onset  . Heart disease Maternal Grandmother   . Cancer Maternal  Grandfather   . Hypertension Other   . Diabetes Other   . Cancer Other       Review of Systems  Constitutional: negative for fatigue and weight loss Respiratory: negative for cough and wheezing Cardiovascular: negative for chest pain, fatigue and palpitations Gastrointestinal: negative for abdominal pain and change in bowel habits Musculoskeletal:negative for myalgias Neurological: negative for gait problems and tremors Behavioral/Psych: negative for abusive relationship, depression Endocrine: negative for temperature intolerance    Genitourinary:negative for abnormal menstrual periods, genital lesions, hot flashes, sexual problems and vaginal discharge Integument/breast: negative for breast lump, breast tenderness, nipple discharge and skin lesion(s)    Objective:       BP 119/78   Pulse 80   Ht 4\' 11"  (1.499 m)   Wt 174 lb (78.9 kg)   BMI 35.14 kg/m  General:   alert  Skin:   no rash or abnormalities  Lungs:   clear to auscultation bilaterally  Heart:   regular rate and rhythm, S1, S2 normal, no murmur, click, rub or gallop  Breasts:   normal without suspicious masses, skin or nipple changes or axillary nodes  Abdomen:  normal findings: no organomegaly, soft, non-tender and no hernia  Pelvis:  External genitalia: normal general appearance Urinary system: urethral meatus normal and bladder without fullness, nontender Vaginal: normal without tenderness, induration or masses Cervix: normal appearance Adnexa: normal bimanual exam Uterus: anteverted and non-tender, normal  size   Lab Review Urine pregnancy test Labs reviewed yes Radiologic studies reviewed no  50% of 20 min visit spent on counseling and coordination of care.   Assessment:     1. Encounter for routine gynecological examination with Papanicolaou smear of cervix Rx: - Cytology - PAP( [facility])  2. Vaginal discharge Rx: - Cervicovaginal ancillary only  3. Screen for STD (sexually transmitted  disease) Rx: - HIV Antibody (routine testing w rflx) - RPR - Hepatitis C antibody - Hepatitis B surface antigen  4. Encounter for surveillance of implantable subdermal contraceptive - wants Nexplanon removed and reinserted  5. History of UTI Rx: - Urine Culture ( TOC )    Plan:    Education reviewed: calcium supplements, depression evaluation, low fat, low cholesterol diet, safe sex/STD prevention, self breast exams, smoking cessation and weight bearing exercise. Contraception: Nexplanon. Follow up in: 2 weeks. Nexplanon Removal AND REINSERTION  No orders of the defined types were placed in this encounter.  No orders of the defined types were placed in this encounter.   Brock Bad MD 11-11-2018

## 2018-11-12 LAB — HEPATITIS B SURFACE ANTIGEN: Hepatitis B Surface Ag: NEGATIVE

## 2018-11-12 LAB — HEPATITIS C ANTIBODY

## 2018-11-12 LAB — HIV ANTIBODY (ROUTINE TESTING W REFLEX): HIV Screen 4th Generation wRfx: NONREACTIVE

## 2018-11-12 LAB — CERVICOVAGINAL ANCILLARY ONLY
CHLAMYDIA, DNA PROBE: NEGATIVE
NEISSERIA GONORRHEA: NEGATIVE

## 2018-11-12 LAB — RPR: RPR Ser Ql: NONREACTIVE

## 2018-11-13 LAB — URINE CULTURE

## 2018-11-19 LAB — CYTOLOGY - PAP
Diagnosis: UNDETERMINED — AB
HPV 16/18/45 GENOTYPING: NEGATIVE
HPV: DETECTED — AB

## 2018-11-26 ENCOUNTER — Encounter: Payer: Self-pay | Admitting: Obstetrics

## 2018-11-26 ENCOUNTER — Ambulatory Visit (INDEPENDENT_AMBULATORY_CARE_PROVIDER_SITE_OTHER): Payer: BLUE CROSS/BLUE SHIELD | Admitting: Obstetrics

## 2018-11-26 VITALS — BP 125/79 | HR 94 | Wt 173.6 lb

## 2018-11-26 DIAGNOSIS — Z3202 Encounter for pregnancy test, result negative: Secondary | ICD-10-CM | POA: Diagnosis not present

## 2018-11-26 DIAGNOSIS — Z3046 Encounter for surveillance of implantable subdermal contraceptive: Secondary | ICD-10-CM

## 2018-11-26 DIAGNOSIS — Z01818 Encounter for other preprocedural examination: Secondary | ICD-10-CM

## 2018-11-26 LAB — POCT URINE PREGNANCY: PREG TEST UR: NEGATIVE

## 2018-11-26 MED ORDER — ETONOGESTREL 68 MG ~~LOC~~ IMPL
68.0000 mg | DRUG_IMPLANT | Freq: Once | SUBCUTANEOUS | Status: AC
  Administered 2018-11-26: 68 mg via SUBCUTANEOUS

## 2018-11-26 NOTE — Addendum Note (Signed)
Addended by: Dalphine Handing on: 11/26/2018 02:36 PM   Modules accepted: Orders

## 2018-11-26 NOTE — Progress Notes (Signed)
Nexplanon Procedure Note    PROCEDURE: NEXPLANON REMOVAL AND REINSERTION Performing Provider: Brock Bad MD  Patient education prior to procedure, explained risk, benefits of Nexplanon, reviewed alternative options. Patient reported understanding. Gave consent to continue with procedure.  Patient had NEXPLANON inserted in 2016. Desires removal today w/ reinsertion  PROCEDURE:  Pregnancy Text :  not indicated Site (check):      left arm         Sterile Preparation:   Betadinex3 Lot # [id number] Expiration Date:  2022  JUN  27    The patient's left arm was palpated and the implant device located. The area was prepped with Betadinex3. The distal end of the device was palpated and 1.5 cc of 1% lidocaine without epinephrine was injected. A 1.5 mm incision was made. Any fibrotic tissue was carefully dissected away using blunt and/or sharp dissection. The device was removed in an intact manner.   Insertion site was the same as the removal site. Nexplanon  was inserted subcutaneously.Needle was removed from the insertion site. Nexplanon capsule was palpated by provider and patient to assure satisfactory placement. Dressing applied.  Followup: The patient tolerated the procedure well without complications.  Standard post-procedure care is explained and return precautions are given.  Brock Bad MD 11-26-2018

## 2018-11-26 NOTE — Progress Notes (Signed)
Pt presents for Nexplanon removal and reinsertion.

## 2018-11-26 NOTE — Addendum Note (Signed)
Addended by: Dalphine Handing on: 11/26/2018 01:00 PM   Modules accepted: Orders

## 2018-12-18 ENCOUNTER — Encounter: Payer: Self-pay | Admitting: Obstetrics

## 2018-12-18 ENCOUNTER — Ambulatory Visit (INDEPENDENT_AMBULATORY_CARE_PROVIDER_SITE_OTHER): Payer: BLUE CROSS/BLUE SHIELD | Admitting: Obstetrics

## 2018-12-18 VITALS — BP 110/73 | HR 92 | Wt 174.8 lb

## 2018-12-18 DIAGNOSIS — Z3046 Encounter for surveillance of implantable subdermal contraceptive: Secondary | ICD-10-CM

## 2018-12-18 NOTE — Progress Notes (Signed)
Subjective:    Elizabeth Coleman is a 23 y.o. female who presents for post op check after Nexplanon Removal / Reinsertion. The patient has no complaints today. The patient is sexually active. Pertinent past medical history: current smoker.  The information documented in the HPI was reviewed and verified.  Menstrual History: OB History    Gravida  0   Para  0   Term  0   Preterm  0   AB  0   Living  0     SAB  0   TAB  0   Ectopic  0   Multiple  0   Live Births               No LMP recorded. Patient has had an implant.   Patient Active Problem List   Diagnosis Date Noted  . Chlamydia trachomatis infection of lower genitourinary sites 01/05/2014  . Screening examination for venereal disease 10/28/2013  . History of chlamydia infection 09/01/2013  . Urinary tract infection, site not specified 07/08/2013  . Vaginitis and vulvovaginitis, unspecified 07/08/2013   Past Medical History:  Diagnosis Date  . Chlamydia   . Trichomonal infection     No past surgical history on file.   Current Outpatient Medications:  .  etonogestrel (NEXPLANON) 68 MG IMPL implant, 1 each by Subdermal route once., Disp: , Rfl:  No Known Allergies  Social History   Tobacco Use  . Smoking status: Light Tobacco Smoker    Packs/day: 0.25    Types: Cigarettes  . Smokeless tobacco: Never Used  . Tobacco comment: trying to stop   Substance Use Topics  . Alcohol use: Yes    Alcohol/week: 0.0 standard drinks    Comment: occ    Family History  Problem Relation Age of Onset  . Heart disease Maternal Grandmother   . Cancer Maternal Grandfather   . Hypertension Other   . Diabetes Other   . Cancer Other        Review of Systems Constitutional: negative for weight loss Genitourinary:negative for abnormal menstrual periods and vaginal discharge   Objective:   BP 110/73   Pulse 92   Wt 174 lb 12.8 oz (79.3 kg)   BMI 35.31 kg/m    PE:          General:  Alert and no  distress          Left Upper Extremity:  Nexplanon site is clean, dry and nontender.  Rod palpated and is intact.  Lab Review Urine pregnancy test Labs reviewed yes Radiologic studies reviewed no  50% of 15 min visit spent on counseling and coordination of care.    Assessment:    23 y.o., continuing Nexplanon, no contraindications.   Plan:    All questions answered. Contraception: Nexplanon. Discussed healthy lifestyle modifications. Follow up in 1 year. No orders of the defined types were placed in this encounter.  No orders of the defined types were placed in this encounter.   Brock Bad MD 12-18-2018

## 2018-12-18 NOTE — Progress Notes (Signed)
Pt is here for nexplanon F/U, placed 11/26/2018. Pt has no concerns other than arm is still bruised from insertion.

## 2019-01-29 ENCOUNTER — Ambulatory Visit: Payer: Self-pay

## 2019-02-01 ENCOUNTER — Ambulatory Visit: Payer: BLUE CROSS/BLUE SHIELD

## 2019-02-05 ENCOUNTER — Ambulatory Visit (INDEPENDENT_AMBULATORY_CARE_PROVIDER_SITE_OTHER): Payer: BLUE CROSS/BLUE SHIELD

## 2019-02-05 ENCOUNTER — Other Ambulatory Visit: Payer: Self-pay

## 2019-02-05 DIAGNOSIS — N898 Other specified noninflammatory disorders of vagina: Secondary | ICD-10-CM

## 2019-02-05 DIAGNOSIS — Z113 Encounter for screening for infections with a predominantly sexual mode of transmission: Secondary | ICD-10-CM | POA: Diagnosis not present

## 2019-02-05 DIAGNOSIS — B9689 Other specified bacterial agents as the cause of diseases classified elsewhere: Secondary | ICD-10-CM | POA: Diagnosis not present

## 2019-02-05 DIAGNOSIS — N760 Acute vaginitis: Secondary | ICD-10-CM | POA: Diagnosis not present

## 2019-02-05 NOTE — Progress Notes (Signed)
Nurse visit for STD screening. Pt also c/o vaginal discharge.

## 2019-02-06 LAB — HIV ANTIBODY (ROUTINE TESTING W REFLEX): HIV Screen 4th Generation wRfx: NONREACTIVE

## 2019-02-06 LAB — RPR: RPR Ser Ql: NONREACTIVE

## 2019-02-06 LAB — HEPATITIS B SURFACE ANTIGEN: Hepatitis B Surface Ag: NEGATIVE

## 2019-02-06 LAB — HEPATITIS C ANTIBODY: Hep C Virus Ab: <0.1 s/co ratio (ref 0.0–0.9)

## 2019-02-09 LAB — CERVICOVAGINAL ANCILLARY ONLY
Bacterial vaginitis: POSITIVE — AB
Candida vaginitis: NEGATIVE
Chlamydia: NEGATIVE
Neisseria Gonorrhea: NEGATIVE
Trichomonas: NEGATIVE

## 2019-02-12 ENCOUNTER — Other Ambulatory Visit: Payer: Self-pay | Admitting: *Deleted

## 2019-02-12 MED ORDER — METRONIDAZOLE 500 MG PO TABS: 500.0000 mg | ORAL_TABLET | Freq: Two times a day (BID) | ORAL | 0 refills | Status: DC

## 2019-02-12 NOTE — Progress Notes (Signed)
Flagyl for BV ordered

## 2019-03-29 ENCOUNTER — Emergency Department (HOSPITAL_COMMUNITY)
Admission: EM | Admit: 2019-03-29 | Discharge: 2019-03-29 | Disposition: A | Discharge status: Home / Self Care | Payer: BC Managed Care – PPO | Attending: Emergency Medicine | Admitting: Emergency Medicine

## 2019-03-29 ENCOUNTER — Emergency Department (HOSPITAL_COMMUNITY): Payer: BC Managed Care – PPO

## 2019-03-29 ENCOUNTER — Other Ambulatory Visit: Payer: Self-pay

## 2019-03-29 ENCOUNTER — Encounter (HOSPITAL_COMMUNITY): Payer: Self-pay

## 2019-03-29 DIAGNOSIS — R1031 Right lower quadrant pain: Secondary | ICD-10-CM | POA: Diagnosis not present

## 2019-03-29 DIAGNOSIS — Z79899 Other long term (current) drug therapy: Secondary | ICD-10-CM | POA: Insufficient documentation

## 2019-03-29 DIAGNOSIS — N12 Tubulo-interstitial nephritis, not specified as acute or chronic: Secondary | ICD-10-CM | POA: Insufficient documentation

## 2019-03-29 DIAGNOSIS — R300 Dysuria: Secondary | ICD-10-CM | POA: Diagnosis not present

## 2019-03-29 DIAGNOSIS — M545 Low back pain: Secondary | ICD-10-CM | POA: Diagnosis not present

## 2019-03-29 DIAGNOSIS — F17210 Nicotine dependence, cigarettes, uncomplicated: Secondary | ICD-10-CM | POA: Diagnosis not present

## 2019-03-29 LAB — COMPREHENSIVE METABOLIC PANEL
ALT: 14 U/L (ref 0–44)
AST: 17 U/L (ref 15–41)
Albumin: 4.2 g/dL (ref 3.5–5.0)
Alkaline Phosphatase: 48 U/L (ref 38–126)
Anion gap: 9 (ref 5–15)
BUN: 7 mg/dL (ref 6–20)
CO2: 25 mmol/L (ref 22–32)
Calcium: 9.7 mg/dL (ref 8.9–10.3)
Chloride: 104 mmol/L (ref 98–111)
Creatinine, Ser: 1.03 mg/dL — ABNORMAL HIGH (ref 0.44–1.00)
GFR calc Af Amer: >60 mL/min (ref >60)
GFR calc non Af Amer: >60 mL/min (ref >60)
Glucose, Bld: 101 mg/dL — ABNORMAL HIGH (ref 70–99)
Potassium: 4.4 mmol/L (ref 3.5–5.1)
Sodium: 138 mmol/L (ref 135–145)
Total Bilirubin: 0.7 mg/dL (ref 0.3–1.2)
Total Protein: 7.5 g/dL (ref 6.5–8.1)

## 2019-03-29 LAB — URINALYSIS, ROUTINE W REFLEX MICROSCOPIC
Bacteria, UA: NONE SEEN
Bilirubin Urine: NEGATIVE
Glucose, UA: NEGATIVE mg/dL
Ketones, ur: 5 mg/dL — AB
Nitrite: POSITIVE — AB
Protein, ur: 100 mg/dL — AB
RBC / HPF: >50 RBC/hpf — ABNORMAL HIGH (ref 0–5)
Specific Gravity, Urine: 1.017 (ref 1.005–1.030)
WBC, UA: >50 WBC/hpf — ABNORMAL HIGH (ref 0–5)
pH: 5 (ref 5.0–8.0)

## 2019-03-29 LAB — CBC WITH DIFFERENTIAL/PLATELET
Abs Immature Granulocytes: 0.04 10*3/uL (ref 0.00–0.07)
Basophils Absolute: 0 10*3/uL (ref 0.0–0.1)
Basophils Relative: 0 %
Eosinophils Absolute: 0.1 10*3/uL (ref 0.0–0.5)
Eosinophils Relative: 1 %
HCT: 39.1 % (ref 36.0–46.0)
Hemoglobin: 12.5 g/dL (ref 12.0–15.0)
Immature Granulocytes: 0 %
Lymphocytes Relative: 23 %
Lymphs Abs: 2.7 10*3/uL (ref 0.7–4.0)
MCH: 27.2 pg (ref 26.0–34.0)
MCHC: 32 g/dL (ref 30.0–36.0)
MCV: 85.2 fL (ref 80.0–100.0)
Monocytes Absolute: 1 10*3/uL (ref 0.1–1.0)
Monocytes Relative: 9 %
Neutro Abs: 7.8 10*3/uL — ABNORMAL HIGH (ref 1.7–7.7)
Neutrophils Relative %: 67 %
Platelets: 300 10*3/uL (ref 150–400)
RBC: 4.59 MIL/uL (ref 3.87–5.11)
RDW: 13.4 % (ref 11.5–15.5)
WBC: 11.8 10*3/uL — ABNORMAL HIGH (ref 4.0–10.5)
nRBC: 0 % (ref 0.0–0.2)

## 2019-03-29 LAB — POC URINE PREG, ED: Preg Test, Ur: NEGATIVE

## 2019-03-29 LAB — LACTIC ACID, PLASMA: Lactic Acid, Venous: 0.7 mmol/L (ref 0.5–1.9)

## 2019-03-29 MED ORDER — SODIUM CHLORIDE 0.9 % IV SOLN
1.0000 g | Freq: Once | INTRAVENOUS | Status: AC
  Administered 2019-03-29: 1 g via INTRAVENOUS
  Filled 2019-03-29: qty 10

## 2019-03-29 MED ORDER — SODIUM CHLORIDE 0.9 % IV BOLUS
1000.0000 mL | Freq: Once | INTRAVENOUS | Status: AC
Start: 2019-03-29 — End: 2019-03-29
  Administered 2019-03-29: 1000 mL via INTRAVENOUS

## 2019-03-29 MED ORDER — ONDANSETRON 4 MG PO TBDP: 4.0000 mg | ORAL_TABLET | Freq: Three times a day (TID) | ORAL | 0 refills | Status: DC | PRN

## 2019-03-29 MED ORDER — ONDANSETRON HCL 4 MG/2ML IJ SOLN
4.0000 mg | Freq: Once | INTRAMUSCULAR | Status: AC | PRN
  Administered 2019-03-29: 4 mg via INTRAVENOUS
  Filled 2019-03-29: qty 2

## 2019-03-29 MED ORDER — CEPHALEXIN 500 MG PO CAPS: 500.0000 mg | ORAL_CAPSULE | Freq: Four times a day (QID) | ORAL | 0 refills | Status: DC

## 2019-03-29 MED ORDER — IBUPROFEN 800 MG PO TABS: 800.0000 mg | ORAL_TABLET | Freq: Three times a day (TID) | ORAL | 0 refills | Status: DC | PRN

## 2019-03-29 MED ORDER — MORPHINE SULFATE (PF) 4 MG/ML IV SOLN
4.0000 mg | Freq: Once | INTRAVENOUS | Status: AC
  Administered 2019-03-29: 4 mg via INTRAVENOUS
  Filled 2019-03-29: qty 1

## 2019-03-29 NOTE — Discharge Instructions (Signed)
It was my pleasure taking care of you today!   Please take all of your antibiotics until finished!  Zofran as needed for nausea.  Ibuprofen as needed for pain. You can also use Tylenol as well for further pain control if needed.   Follow up with your primary care doctor.   Return to ER if you develop a fever, continue to throw up despite nausea medications, new or worsening symptoms develop or you have any additional concerns.

## 2019-03-29 NOTE — ED Provider Notes (Signed)
[facility] Provider Note   CSN: [id number] Arrival date & time: 03/29/19  [id number]    History   Chief Complaint Chief Complaint  Patient presents with  . Dysuria    HPI Elizabeth Coleman is a 23 y.o. female.     The history is provided by the patient and medical records. No language interpreter was used.  Dysuria Associated symptoms: abdominal pain, flank pain, nausea and vomiting    Elizabeth Coleman is a 23 y.o. female  with a PMH as listed below who presents to the Emergency Department complaining of persistent dysuria and urinary frequency that began 1 week ago.  She assumed she had a urinary tract infection.  She took Azo, but does not feel like it has helped very much.  This morning, she woke up having right back and flank pain.  She has also been experiencing nausea today, one episode of vomiting.  No fevers.   Past Medical History:  Diagnosis Date  . Chlamydia   . Trichomonal infection     Patient Active Problem List   Diagnosis Date Noted  . Chlamydia trachomatis infection of lower genitourinary sites 01/05/2014  . Screening examination for venereal disease 10/28/2013  . History of chlamydia infection 09/01/2013  . Urinary tract infection, site not specified 07/08/2013  . Vaginitis and vulvovaginitis, unspecified 07/08/2013    History reviewed. No pertinent surgical history.   OB History    Gravida  0   Para  0   Term  0   Preterm  0   AB  0   Living  0     SAB  0   TAB  0   Ectopic  0   Multiple  0   Live Births               Home Medications    Prior to Admission medications   Medication Sig Start Date End Date Taking? Authorizing Provider  cephALEXin (KEFLEX) 500 MG capsule Take 1 capsule (500 mg total) by mouth 4 (four) times daily. 03/29/19   Laryssa Hassing, Chase PicketJaime Pilcher, PA-C  etonogestrel (NEXPLANON) 68 MG IMPL implant 1 each by Subdermal route once.    [provider]  ibuprofen (ADVIL)  800 MG tablet Take 1 tablet (800 mg total) by mouth every 8 (eight) hours as needed for mild pain or moderate pain. 03/29/19   Aura Bibby, Chase PicketJaime Pilcher, PA-C  metroNIDAZOLE (FLAGYL) 500 MG tablet Take 1 tablet (500 mg total) by mouth 2 (two) times daily. 02/12/19   Hermina StaggersErvin, Michael L, MD  ondansetron (ZOFRAN ODT) 4 MG disintegrating tablet Take 1 tablet (4 mg total) by mouth every 8 (eight) hours as needed for nausea or vomiting. 03/29/19   Jaman Aro, Chase PicketJaime Pilcher, PA-C    Family History Family History  Problem Relation Age of Onset  . Heart disease Maternal Grandmother   . Cancer Maternal Grandfather   . Hypertension Other   . Diabetes Other   . Cancer Other     Social History Social History   Tobacco Use  . Smoking status: Light Tobacco Smoker    Packs/day: 0.25    Types: Cigarettes  . Smokeless tobacco: Never Used  . Tobacco comment: trying to stop   Substance Use Topics  . Alcohol use: Yes    Alcohol/week: 0.0 standard drinks    Comment: occ  . Drug use: No    Frequency: 1.0 times per week    Types: Marijuana  Allergies   Patient has no known allergies.   Review of Systems Review of Systems  Gastrointestinal: Positive for abdominal pain, nausea and vomiting. Negative for constipation and diarrhea.  Genitourinary: Positive for dysuria, flank pain and frequency.  Musculoskeletal: Positive for back pain.  All other systems reviewed and are negative.    Physical Exam Updated Vital Signs BP (!) 114/92   Pulse 79   Temp 98.3 F (36.8 C)   Resp 20   SpO2 100%   Physical Exam Vitals signs and nursing note reviewed.  Constitutional:      General: She is not in acute distress.    Appearance: She is well-developed.  HENT:     Head: Normocephalic and atraumatic.  Neck:     Musculoskeletal: Neck supple.  Cardiovascular:     Rate and Rhythm: Normal rate and regular rhythm.     Heart sounds: Normal heart sounds. No murmur.  Pulmonary:     Effort: Pulmonary effort is  normal. No respiratory distress.     Breath sounds: Normal breath sounds.  Abdominal:     General: There is no distension.     Palpations: Abdomen is soft.     Comments: Suprapubic tenderness.  No rebound or guarding. + Right CVA tenderness  Skin:    General: Skin is warm and dry.  Neurological:     Mental Status: She is alert and oriented to person, place, and time.      ED Treatments / Results  Labs (all labs ordered are listed, but only abnormal results are displayed) Labs Reviewed  URINALYSIS, ROUTINE W REFLEX MICROSCOPIC - Abnormal; Notable for the following components:      Result Value   APPearance CLOUDY (*)    Hgb urine dipstick LARGE (*)    Ketones, ur 5 (*)    Protein, ur 100 (*)    Nitrite POSITIVE (*)    Leukocytes,Ua LARGE (*)    RBC / HPF >50 (*)    WBC, UA >50 (*)    All other components within normal limits  CBC WITH DIFFERENTIAL/PLATELET - Abnormal; Notable for the following components:   WBC 11.8 (*)    Neutro Abs 7.8 (*)    All other components within normal limits  COMPREHENSIVE METABOLIC PANEL - Abnormal; Notable for the following components:   Glucose, Bld 101 (*)    Creatinine, Ser 1.03 (*)    All other components within normal limits  LACTIC ACID, PLASMA  LACTIC ACID, PLASMA  POC URINE PREG, ED    EKG None  Radiology Ct Renal Stone Study  Result Date: 03/29/2019 CLINICAL DATA:  Dysuria for 1 week with low back pain. EXAM: CT ABDOMEN AND PELVIS WITHOUT CONTRAST TECHNIQUE: Multidetector CT imaging of the abdomen and pelvis was performed following the standard protocol without IV contrast. COMPARISON:  11/05/2005 FINDINGS: Lower chest: Clear lung bases. Hepatobiliary: No focal liver abnormality is seen. No gallstones, gallbladder wall thickening, or biliary dilatation. Pancreas: Unremarkable. Spleen: Unremarkable. Adrenals/Urinary Tract: Unremarkable adrenal glands. No evidence of renal calculi, hydronephrosis, or mass on this unenhanced study.  Unremarkable bladder. Stomach/Bowel: The stomach is nondistended. There is no evidence of bowel obstruction or inflammation. The appendix contains gas and is unremarkable. Vascular/Lymphatic: No significant vascular findings are present. No enlarged abdominal or pelvic lymph nodes. Reproductive: Uterus and bilateral adnexa are unremarkable. Other: No ascites. No abdominal wall hernia. Musculoskeletal: No acute osseous abnormality or suspicious osseous lesion. IMPRESSION: No evidence of urinary tract calculi, obstruction, or other acute abnormality.  Electronically Signed   By: Logan Bores M.D.   On: 03/29/2019 05:57    Procedures Procedures (including critical care time)  Medications Ordered in ED Medications  ondansetron (ZOFRAN) injection 4 mg (4 mg Intravenous Given 03/29/19 0406)  morphine 4 MG/ML injection 4 mg (4 mg Intravenous Given 03/29/19 0406)  sodium chloride 0.9 % bolus 1,000 mL (1,000 mLs Intravenous New Bag/Given 03/29/19 0405)  cefTRIAXone (ROCEPHIN) 1 g in sodium chloride 0.9 % 100 mL IVPB (1 g Intravenous New Bag/Given 03/29/19 0426)     Initial Impression / Assessment and Plan / ED Course  I have reviewed the triage vital signs and the nursing notes.  Pertinent labs & imaging results that were available during my care of the patient were reviewed by me and considered in my medical decision making (see chart for details).       Elizabeth Coleman is a 23 y.o. female who presents to ED for dysuria for a week with flank pain, back pain and nausea developing today.  She had one episode of emesis.  On exam, she is afebrile, hemodynamically stable with right CVA and flank tenderness.  She has suprapubic tenderness as well.  She is nontoxic-appearing.  She does have leukocytosis of 11.8.  Mild bump in creatinine at 1.03.  She was given a fluid bolus.  Her lactic acid was normal.  Doubt sepsis.  Urinalysis shows nitrite positive, large leuks with greater than 50 WBCs.  Given dose of  Rocephin in the ED. Ct renal without acute abnormalities.  On reevaluation, patient is able to tolerate p.o.  Feel that she is a good candidate for outpatient treatment of her pyelonephritis.  We did discuss reasons to return to the emergency department at length.  PCP follow-up for recheck encouraged.  All questions answered.  Final Clinical Impressions(s) / ED Diagnoses   Final diagnoses:  Pyelonephritis    ED Discharge Orders         Ordered    cephALEXin (KEFLEX) 500 MG capsule  4 times daily     03/29/19 0606    ondansetron (ZOFRAN ODT) 4 MG disintegrating tablet  Every 8 hours PRN     03/29/19 0606    ibuprofen (ADVIL) 800 MG tablet  Every 8 hours PRN     03/29/19 0606           Kele Withem, Ozella Almond, PA-C 03/29/19 [id number]    Palumbo, April, MD 03/29/19 [id number]

## 2019-03-29 NOTE — ED Triage Notes (Signed)
Pt states that she has been having dysuria for the past week with lower back pain, took AZO without relief. Pt also wants a pregnancy test, denies vaginal discharge

## 2019-04-02 ENCOUNTER — Ambulatory Visit: Payer: BC Managed Care – PPO

## 2019-04-13 ENCOUNTER — Ambulatory Visit (INDEPENDENT_AMBULATORY_CARE_PROVIDER_SITE_OTHER): Payer: BC Managed Care – PPO

## 2019-04-13 ENCOUNTER — Other Ambulatory Visit: Payer: Self-pay

## 2019-04-13 DIAGNOSIS — N898 Other specified noninflammatory disorders of vagina: Secondary | ICD-10-CM | POA: Diagnosis not present

## 2019-04-13 DIAGNOSIS — N760 Acute vaginitis: Secondary | ICD-10-CM | POA: Diagnosis not present

## 2019-04-13 DIAGNOSIS — Z113 Encounter for screening for infections with a predominantly sexual mode of transmission: Secondary | ICD-10-CM

## 2019-04-13 DIAGNOSIS — B9689 Other specified bacterial agents as the cause of diseases classified elsewhere: Secondary | ICD-10-CM

## 2019-04-13 DIAGNOSIS — Z87440 Personal history of urinary (tract) infections: Secondary | ICD-10-CM

## 2019-04-13 LAB — POCT URINALYSIS DIPSTICK
Bilirubin, UA: NEGATIVE
Blood, UA: NEGATIVE
Glucose, UA: NEGATIVE
Ketones, UA: NEGATIVE
Leukocytes, UA: NEGATIVE
Nitrite, UA: NEGATIVE
Protein, UA: NEGATIVE
Spec Grav, UA: 1.01 (ref 1.010–1.025)
Urobilinogen, UA: 0.2 E.U./dL
pH, UA: 6.5 (ref 5.0–8.0)

## 2019-04-13 NOTE — Progress Notes (Signed)
Pt presents for follow up uti. She states that her sx have resolved since completing atb, but she would like to make sure her urine is clear. She also request for std screen and blood work.

## 2019-04-14 LAB — CERVICOVAGINAL ANCILLARY ONLY
Bacterial vaginitis: POSITIVE — AB
Candida vaginitis: NEGATIVE
Chlamydia: NEGATIVE
Neisseria Gonorrhea: NEGATIVE
Trichomonas: NEGATIVE

## 2019-04-15 ENCOUNTER — Other Ambulatory Visit: Payer: Self-pay

## 2019-04-15 DIAGNOSIS — N760 Acute vaginitis: Secondary | ICD-10-CM

## 2019-04-15 DIAGNOSIS — B9689 Other specified bacterial agents as the cause of diseases classified elsewhere: Secondary | ICD-10-CM

## 2019-04-15 LAB — RPR: RPR Ser Ql: NONREACTIVE

## 2019-04-15 LAB — HEPATITIS C ANTIBODY: Hep C Virus Ab: <0.1 s/co ratio (ref 0.0–0.9)

## 2019-04-15 LAB — HIV ANTIBODY (ROUTINE TESTING W REFLEX): HIV Screen 4th Generation wRfx: NONREACTIVE

## 2019-04-15 LAB — HEPATITIS B SURFACE ANTIGEN: Hepatitis B Surface Ag: NEGATIVE

## 2019-04-15 MED ORDER — METRONIDAZOLE 500 MG PO TABS: 500.0000 mg | ORAL_TABLET | Freq: Two times a day (BID) | ORAL | 0 refills | Status: DC

## 2019-04-15 NOTE — Progress Notes (Signed)
Rx was not sent. Pt went to pharmacy no record of Rx was there.  Rx sent for pt today.

## 2019-09-07 ENCOUNTER — Other Ambulatory Visit: Payer: Self-pay

## 2019-09-07 DIAGNOSIS — Z20822 Contact with and (suspected) exposure to covid-19: Secondary | ICD-10-CM

## 2019-09-09 LAB — NOVEL CORONAVIRUS, NAA: SARS-CoV-2, NAA: NOT DETECTED

## 2019-09-28 ENCOUNTER — Other Ambulatory Visit: Payer: Self-pay

## 2019-09-28 ENCOUNTER — Ambulatory Visit (INDEPENDENT_AMBULATORY_CARE_PROVIDER_SITE_OTHER): Payer: Self-pay

## 2019-09-28 DIAGNOSIS — N898 Other specified noninflammatory disorders of vagina: Secondary | ICD-10-CM

## 2019-09-28 DIAGNOSIS — N760 Acute vaginitis: Secondary | ICD-10-CM

## 2019-09-28 DIAGNOSIS — Z113 Encounter for screening for infections with a predominantly sexual mode of transmission: Secondary | ICD-10-CM

## 2019-09-28 DIAGNOSIS — B9689 Other specified bacterial agents as the cause of diseases classified elsewhere: Secondary | ICD-10-CM

## 2019-09-28 NOTE — Progress Notes (Signed)
SUBJECTIVE:  23 y.o. female presents for STI Screening because she has a new partner.  Denies discharge, abnormal vaginal bleeding or significant pelvic pain or fever. No UTI symptoms. Denies history of known exposure to STD.  No LMP recorded. Patient has had an implant.  OBJECTIVE:  She appears well, afebrile. Urine dipstick: not done.  ASSESSMENT:    PLAN:  GC, chlamydia, trichomonas, BVAG, CVAG probe sent to lab. STI Panel done. Treatment: To be determined once lab results are received ROV prn if symptoms persist or worsen.

## 2019-09-29 ENCOUNTER — Other Ambulatory Visit: Payer: Self-pay | Admitting: Obstetrics

## 2019-09-29 DIAGNOSIS — B9689 Other specified bacterial agents as the cause of diseases classified elsewhere: Secondary | ICD-10-CM

## 2019-09-29 DIAGNOSIS — N760 Acute vaginitis: Secondary | ICD-10-CM

## 2019-09-29 LAB — CERVICOVAGINAL ANCILLARY ONLY
Bacterial Vaginitis (gardnerella): POSITIVE — AB
Candida Glabrata: NEGATIVE
Candida Vaginitis: NEGATIVE
Chlamydia: NEGATIVE
Comment: NEGATIVE
Comment: NEGATIVE
Comment: NEGATIVE
Comment: NEGATIVE
Comment: NEGATIVE
Comment: NORMAL
Neisseria Gonorrhea: NEGATIVE
Trichomonas: NEGATIVE

## 2019-09-29 LAB — HEPATITIS B SURFACE ANTIGEN: Hepatitis B Surface Ag: NEGATIVE

## 2019-09-29 LAB — HIV ANTIBODY (ROUTINE TESTING W REFLEX): HIV Screen 4th Generation wRfx: NONREACTIVE

## 2019-09-29 LAB — HEPATITIS C ANTIBODY: Hep C Virus Ab: <0.1 s/co ratio (ref 0.0–0.9)

## 2019-09-29 LAB — RPR: RPR Ser Ql: NONREACTIVE

## 2019-09-29 MED ORDER — METRONIDAZOLE 500 MG PO TABS: 500.0000 mg | ORAL_TABLET | Freq: Two times a day (BID) | ORAL | 0 refills | Status: DC

## 2020-03-28 ENCOUNTER — Encounter: Payer: Self-pay | Admitting: Obstetrics

## 2020-03-28 ENCOUNTER — Other Ambulatory Visit (HOSPITAL_COMMUNITY)
Admission: RE | Admit: 2020-03-28 | Discharge: 2020-03-28 | Disposition: A | Discharge status: Home / Self Care | Payer: Self-pay | Source: Ambulatory Visit | Attending: Obstetrics | Admitting: Obstetrics

## 2020-03-28 ENCOUNTER — Ambulatory Visit (INDEPENDENT_AMBULATORY_CARE_PROVIDER_SITE_OTHER): Payer: Self-pay | Admitting: Obstetrics

## 2020-03-28 ENCOUNTER — Other Ambulatory Visit: Payer: Self-pay

## 2020-03-28 VITALS — BP 118/71 | HR 80 | Wt 148.0 lb

## 2020-03-28 DIAGNOSIS — N898 Other specified noninflammatory disorders of vagina: Secondary | ICD-10-CM

## 2020-03-28 DIAGNOSIS — Z113 Encounter for screening for infections with a predominantly sexual mode of transmission: Secondary | ICD-10-CM | POA: Insufficient documentation

## 2020-03-28 DIAGNOSIS — N946 Dysmenorrhea, unspecified: Secondary | ICD-10-CM

## 2020-03-28 MED ORDER — IBUPROFEN 800 MG PO TABS: 800.0000 mg | ORAL_TABLET | Freq: Three times a day (TID) | ORAL | 0 refills | Status: DC | PRN

## 2020-03-28 NOTE — Progress Notes (Signed)
Patient ID: Elizabeth Coleman, female   DOB: Feb 20, 1996, 24 y.o.   MRN: [id number]  Chief Complaint  Patient presents with  . Exposure to STD    HPI Elizabeth Coleman is a 24 y.o. female.  Requests STD testing. HPI  Past Medical History:  Diagnosis Date  . Chlamydia   . Trichomonal infection     History reviewed. No pertinent surgical history.  Family History  Problem Relation Age of Onset  . Heart disease Maternal Grandmother   . Cancer Maternal Grandfather   . Hypertension Other   . Diabetes Other   . Cancer Other     Social History Social History   Tobacco Use  . Smoking status: Light Tobacco Smoker    Packs/day: 0.25    Types: Cigarettes  . Smokeless tobacco: Never Used  . Tobacco comment: trying to stop   Vaping Use  . Vaping Use: Never used  Substance Use Topics  . Alcohol use: Yes    Alcohol/week: 0.0 standard drinks    Comment: occ  . Drug use: No    Frequency: 1.0 times per week    Types: Marijuana    No Known Allergies  Current Outpatient Medications  Medication Sig Dispense Refill  . cephALEXin (KEFLEX) 500 MG capsule Take 1 capsule (500 mg total) by mouth 4 (four) times daily. 40 capsule 0  . etonogestrel (NEXPLANON) 68 MG IMPL implant 1 each by Subdermal route once.    Marland Kitchen ibuprofen (ADVIL) 800 MG tablet Take 1 tablet (800 mg total) by mouth every 8 (eight) hours as needed for mild pain or moderate pain. 30 tablet 0  . metroNIDAZOLE (FLAGYL) 500 MG tablet Take 1 tablet (500 mg total) by mouth 2 (two) times daily. 14 tablet 0  . ondansetron (ZOFRAN ODT) 4 MG disintegrating tablet Take 1 tablet (4 mg total) by mouth every 8 (eight) hours as needed for nausea or vomiting. 20 tablet 0   No current facility-administered medications for this visit.    Review of Systems Review of Systems Constitutional: negative for fatigue and weight loss Respiratory: negative for cough and wheezing Cardiovascular: negative for chest pain, fatigue and  palpitations Gastrointestinal: negative for abdominal pain and change in bowel habits Genitourinary:negative Integument/breast: negative for nipple discharge Musculoskeletal:negative for myalgias Neurological: negative for gait problems and tremors Behavioral/Psych: negative for abusive relationship, depression Endocrine: negative for temperature intolerance      Blood pressure 118/71, pulse 80, weight 148 lb (67.1 kg).  Physical Exam Physical Exam General:   alert and no distress  Skin:   no rash or abnormalities  Lungs:   clear to auscultation bilaterally  Heart:   regular rate and rhythm, S1, S2 normal, no murmur, click, rub or gallop  Breasts:   normal without suspicious masses, skin or nipple changes or axillary nodes  Abdomen:  normal findings: no organomegaly, soft, non-tender and no hernia  Pelvis:  External genitalia: normal general appearance Urinary system: urethral meatus normal and bladder without fullness, nontender Vaginal: normal without tenderness, induration or masses Cervix: normal appearance Adnexa: normal bimanual exam Uterus: anteverted and non-tender, normal size    50% of 15 min visit spent on counseling and coordination of care.   Data Reviewed Wet Prep  Assessment       1. Screening for STD (sexually transmitted disease) Rx: - HIV Antibody (routine testing w rflx) - Hepatitis B surface antigen - Hepatitis C antibody - RPR  2. Vaginal discharge Rx: - Cervicovaginal ancillary only( [place])  3. Dysmenorrhea Rx: - ibuprofen (ADVIL) 800 MG tablet; Take 1 tablet (800 mg total) by mouth every 8 (eight) hours as needed for mild pain or moderate pain.  Dispense: 30 tablet; Refill: 0  Plan   Follow up in 3 months for Annual  Orders Placed This Encounter  Procedures  . HIV Antibody (routine testing w rflx)  . Hepatitis B surface antigen  . Hepatitis C antibody  . RPR   Meds ordered this encounter  Medications  . ibuprofen (ADVIL) 800 MG  tablet    Sig: Take 1 tablet (800 mg total) by mouth every 8 (eight) hours as needed for mild pain or moderate pain.    Dispense:  30 tablet    Refill:  0     Brock Bad, MD 03/28/2020 4::00 PM

## 2020-03-28 NOTE — Progress Notes (Signed)
RGYN pt presents for STD Screening.   CC:  None pt denies any sx's .

## 2020-03-29 LAB — RPR: RPR Ser Ql: NONREACTIVE

## 2020-03-29 LAB — HEPATITIS C ANTIBODY: Hep C Virus Ab: <0.1 s/co ratio (ref 0.0–0.9)

## 2020-03-29 LAB — HEPATITIS B SURFACE ANTIGEN: Hepatitis B Surface Ag: NEGATIVE

## 2020-03-29 LAB — HIV ANTIBODY (ROUTINE TESTING W REFLEX): HIV Screen 4th Generation wRfx: NONREACTIVE

## 2020-03-30 LAB — CERVICOVAGINAL ANCILLARY ONLY
Bacterial Vaginitis (gardnerella): POSITIVE — AB
Candida Glabrata: NEGATIVE
Candida Vaginitis: NEGATIVE
Chlamydia: NEGATIVE
Comment: NEGATIVE
Comment: NEGATIVE
Comment: NEGATIVE
Comment: NEGATIVE
Comment: NEGATIVE
Comment: NORMAL
Neisseria Gonorrhea: NEGATIVE
Trichomonas: NEGATIVE

## 2020-04-03 ENCOUNTER — Other Ambulatory Visit: Payer: Self-pay | Admitting: Obstetrics

## 2020-04-03 DIAGNOSIS — N760 Acute vaginitis: Secondary | ICD-10-CM

## 2020-04-03 MED ORDER — TINIDAZOLE 500 MG PO TABS: 1000.0000 mg | ORAL_TABLET | Freq: Every day | ORAL | 2 refills | Status: DC

## 2020-04-04 ENCOUNTER — Other Ambulatory Visit: Payer: Self-pay | Admitting: Obstetrics

## 2020-04-04 DIAGNOSIS — N946 Dysmenorrhea, unspecified: Secondary | ICD-10-CM

## 2020-07-29 ENCOUNTER — Other Ambulatory Visit: Payer: Self-pay

## 2020-07-29 DIAGNOSIS — Z20822 Contact with and (suspected) exposure to covid-19: Secondary | ICD-10-CM

## 2020-07-30 LAB — NOVEL CORONAVIRUS, NAA: SARS-CoV-2, NAA: NOT DETECTED

## 2020-07-30 LAB — SARS-COV-2, NAA 2 DAY TAT

## 2020-08-18 ENCOUNTER — Ambulatory Visit: Payer: Self-pay | Admitting: Obstetrics

## 2020-08-29 ENCOUNTER — Encounter: Payer: Self-pay | Admitting: Obstetrics

## 2020-08-29 ENCOUNTER — Ambulatory Visit (INDEPENDENT_AMBULATORY_CARE_PROVIDER_SITE_OTHER): Payer: Managed Care, Other (non HMO) | Admitting: Obstetrics

## 2020-08-29 ENCOUNTER — Other Ambulatory Visit (HOSPITAL_COMMUNITY)
Admission: RE | Admit: 2020-08-29 | Discharge: 2020-08-29 | Disposition: A | Discharge status: Home / Self Care | Payer: Managed Care, Other (non HMO) | Source: Ambulatory Visit | Attending: Obstetrics | Admitting: Obstetrics

## 2020-08-29 ENCOUNTER — Other Ambulatory Visit: Payer: Self-pay

## 2020-08-29 VITALS — BP 122/82 | HR 80 | Wt 152.9 lb

## 2020-08-29 DIAGNOSIS — N898 Other specified noninflammatory disorders of vagina: Secondary | ICD-10-CM | POA: Diagnosis not present

## 2020-08-29 DIAGNOSIS — N946 Dysmenorrhea, unspecified: Secondary | ICD-10-CM

## 2020-08-29 DIAGNOSIS — O98319 Other infections with a predominantly sexual mode of transmission complicating pregnancy, unspecified trimester: Secondary | ICD-10-CM | POA: Diagnosis not present

## 2020-08-29 DIAGNOSIS — Z01419 Encounter for gynecological examination (general) (routine) without abnormal findings: Secondary | ICD-10-CM

## 2020-08-29 DIAGNOSIS — F17200 Nicotine dependence, unspecified, uncomplicated: Secondary | ICD-10-CM

## 2020-08-29 DIAGNOSIS — N760 Acute vaginitis: Secondary | ICD-10-CM | POA: Diagnosis not present

## 2020-08-29 DIAGNOSIS — B9689 Other specified bacterial agents as the cause of diseases classified elsewhere: Secondary | ICD-10-CM

## 2020-08-29 MED ORDER — METRONIDAZOLE 500 MG PO TABS: 500.0000 mg | ORAL_TABLET | Freq: Two times a day (BID) | ORAL | 6 refills | Status: DC

## 2020-08-29 MED ORDER — IBUPROFEN 800 MG PO TABS: 800.0000 mg | ORAL_TABLET | Freq: Three times a day (TID) | ORAL | 5 refills | Status: DC | PRN

## 2020-08-29 NOTE — Progress Notes (Signed)
Pt presents for annual, pap, and STD testing.  

## 2020-08-29 NOTE — Progress Notes (Signed)
Subjective:        Elizabeth Coleman is a 24 y.o. female here for a routine exam.  Current complaints: Recurrent BV.    Personal health questionnaire:  Is patient [name], have a family history of breast and/or ovarian cancer: no Is there a family history of uterine cancer diagnosed at age < 88, gastrointestinal cancer, urinary tract cancer, family member who is a Personnel officer syndrome-associated carrier: no Is the patient overweight and hypertensive, family history of diabetes, personal history of gestational diabetes, preeclampsia or PCOS: no Is patient over 52, have PCOS,  family history of premature CHD under age 55, diabetes, smoke, have hypertension or peripheral artery disease:  no At any time, has a partner hit, kicked or otherwise hurt or frightened you?: no Over the past 2 weeks, have you felt down, depressed or hopeless?: no Over the past 2 weeks, have you felt little interest or pleasure in doing things?:no   Gynecologic History Patient's last menstrual period was 08/23/2020. Contraception: Nexplanon Last Pap: 11-11-2018. Results were: ASCUS with positive HRHPV Last mammogram: n/a. Results were: n/a  Obstetric History OB History  Gravida Para Term Preterm AB Living  0 0 0 0 0 0  SAB TAB Ectopic Multiple Live Births  0 0 0 0      Past Medical History:  Diagnosis Date  . Chlamydia   . Trichomonal infection     History reviewed. No pertinent surgical history.   Current Outpatient Medications:  .  etonogestrel (NEXPLANON) 68 MG IMPL implant, 1 each by Subdermal route once., Disp: , Rfl:  .  cephALEXin (KEFLEX) 500 MG capsule, Take 1 capsule (500 mg total) by mouth 4 (four) times daily. (Patient not taking: Reported on 08/29/2020), Disp: 40 capsule, Rfl: 0 .  ibuprofen (ADVIL) 800 MG tablet, Take 1 tablet (800 mg total) by mouth every 8 (eight) hours as needed for mild pain or moderate pain. (Patient not taking: Reported on 08/29/2020), Disp: 30 tablet, Rfl: 0 .   ibuprofen (ADVIL) 800 MG tablet, Take 1 tablet (800 mg total) by mouth every 8 (eight) hours as needed., Disp: 30 tablet, Rfl: 5 .  metroNIDAZOLE (FLAGYL) 500 MG tablet, Take 1 tablet (500 mg total) by mouth 2 (two) times daily. (Patient not taking: Reported on 08/29/2020), Disp: 14 tablet, Rfl: 0 .  metroNIDAZOLE (FLAGYL) 500 MG tablet, Take 1 tablet (500 mg total) by mouth 2 (two) times daily., Disp: 14 tablet, Rfl: 6 .  ondansetron (ZOFRAN ODT) 4 MG disintegrating tablet, Take 1 tablet (4 mg total) by mouth every 8 (eight) hours as needed for nausea or vomiting. (Patient not taking: Reported on 08/29/2020), Disp: 20 tablet, Rfl: 0 .  tinidazole (TINDAMAX) 500 MG tablet, Take 2 tablets (1,000 mg total) by mouth daily with breakfast. (Patient not taking: Reported on 08/29/2020), Disp: 10 tablet, Rfl: 2 No Known Allergies  Social History   Tobacco Use  . Smoking status: Light Tobacco Smoker    Packs/day: 0.25    Types: Cigarettes  . Smokeless tobacco: Never Used  . Tobacco comment: trying to stop   Substance Use Topics  . Alcohol use: Yes    Alcohol/week: 0.0 standard drinks    Comment: occ    Family History  Problem Relation Age of Onset  . Heart disease Maternal Grandmother   . Cancer Maternal Grandfather   . Hypertension Other   . Diabetes Other   . Cancer Other       Review of Systems  Constitutional:  negative for fatigue and weight loss Respiratory: negative for cough and wheezing Cardiovascular: negative for chest pain, fatigue and palpitations Gastrointestinal: negative for abdominal pain and change in bowel habits Musculoskeletal:negative for myalgias Neurological: negative for gait problems and tremors Behavioral/Psych: negative for abusive relationship, depression Endocrine: negative for temperature intolerance    Genitourinary:negative for abnormal menstrual periods, genital lesions, hot flashes, sexual problems.  Positive for malodorous vaginal discharge,  recurrent Integument/breast: negative for breast lump, breast tenderness, nipple discharge and skin lesion(s)    Objective:       BP 122/82   Pulse 80   Wt 152 lb 14.4 oz (69.4 kg)   LMP 08/23/2020   BMI 30.88 kg/m  General:   alert and no distress  Skin:   no rash or abnormalities  Lungs:   clear to auscultation bilaterally  Heart:   regular rate and rhythm, S1, S2 normal, no murmur, click, rub or gallop  Breasts:   normal without suspicious masses, skin or nipple changes or axillary nodes  Abdomen:  normal findings: no organomegaly, soft, non-tender and no hernia  Pelvis:  External genitalia: normal general appearance Urinary system: urethral meatus normal and bladder without fullness, nontender Vaginal: normal without tenderness, induration or masses Cervix: normal appearance Adnexa: normal bimanual exam Uterus: anteverted and non-tender, normal size   Lab Review Urine pregnancy test Labs reviewed yes Radiologic studies reviewed no  50% of 20 min visit spent on counseling and coordination of care.   Assessment:     1. Encounter for gynecological examination with Papanicolaou smear of cervix Rx: - Cytology - PAP( Elizabeth Coleman)  2. Vaginal discharge Rx: - Cervicovaginal ancillary only( Elizabeth Coleman)  3. Bacterial vaginosis Rx: - metroNIDAZOLE (FLAGYL) 500 MG tablet; Take 1 tablet (500 mg total) by mouth 2 (two) times daily.  Dispense: 14 tablet; Refill: 6  4. Maternal STD affecting childbirth Rx: - Hepatitis C antibody - Hepatitis B surface antigen - RPR - HIV Antibody (routine testing w rflx)  5. Tobacco dependence - cessation with the aid of caloric reduction, exercise and behavioral modification recommended  6. Dysmenorrhea Rx: - ibuprofen (ADVIL) 800 MG tablet; Take 1 tablet (800 mg total) by mouth every 8 (eight) hours as needed.  Dispense: 30 tablet; Refill: 5    Plan:    Education reviewed: calcium supplements, depression evaluation, low fat, low  cholesterol diet, safe sex/STD prevention, self breast exams, smoking cessation and weight bearing exercise. Contraception: Nexplanon. Follow up in: 6 months.  F/U chronic BV  Meds ordered this encounter  Medications  . ibuprofen (ADVIL) 800 MG tablet    Sig: Take 1 tablet (800 mg total) by mouth every 8 (eight) hours as needed.    Dispense:  30 tablet    Refill:  5  . metroNIDAZOLE (FLAGYL) 500 MG tablet    Sig: Take 1 tablet (500 mg total) by mouth 2 (two) times daily.    Dispense:  14 tablet    Refill:  6   Orders Placed This Encounter  Procedures  . Hepatitis C antibody  . Hepatitis B surface antigen  . RPR  . HIV Antibody (routine testing w rflx)    Brock Bad, MD 08/29/2020 11:50 AM

## 2020-08-29 NOTE — Patient Instructions (Signed)
Bacterial Vaginosis  Bacterial vaginosis is a vaginal infection that occurs when the normal balance of bacteria in the vagina is disrupted. It results from an overgrowth of certain bacteria. This is the most common vaginal infection among women ages 15-44. Because bacterial vaginosis increases your risk for STIs (sexually transmitted infections), getting treated can help reduce your risk for chlamydia, gonorrhea, herpes, and HIV (human immunodeficiency virus). Treatment is also important for preventing complications in pregnant women, because this condition can cause an early (premature) delivery. What are the causes? This condition is caused by an increase in harmful bacteria that are normally present in small amounts in the vagina. However, the reason that the condition develops is not fully understood. What increases the risk? The following factors may make you more likely to develop this condition:  Having a new sexual partner or multiple sexual partners.  Having unprotected sex.  Douching.  Having an intrauterine device (IUD).  Smoking.  Drug and alcohol abuse.  Taking certain antibiotic medicines.  Being pregnant. You cannot get bacterial vaginosis from toilet seats, bedding, swimming pools, or contact with objects around you. What are the signs or symptoms? Symptoms of this condition include:  Grey or white vaginal discharge. The discharge can also be watery or foamy.  A fish-like odor with discharge, especially after sexual intercourse or during menstruation.  Itching in and around the vagina.  Burning or pain with urination. Some women with bacterial vaginosis have no signs or symptoms. How is this diagnosed? This condition is diagnosed based on:  Your medical history.  A physical exam of the vagina.  Testing a sample of vaginal fluid under a microscope to look for a large amount of bad bacteria or abnormal cells. Your health care provider may use a cotton swab or  a small wooden spatula to collect the sample. How is this treated? This condition is treated with antibiotics. These may be given as a pill, a vaginal cream, or a medicine that is put into the vagina (suppository). If the condition comes back after treatment, a second round of antibiotics may be needed. Follow these instructions at home: Medicines  Take over-the-counter and prescription medicines only as told by your health care provider.  Take or use your antibiotic as told by your health care provider. Do not stop taking or using the antibiotic even if you start to feel better. General instructions  If you have a female sexual partner, tell her that you have a vaginal infection. She should see her health care provider and be treated if she has symptoms. If you have a female sexual partner, he does not need treatment.  During treatment: ? Avoid sexual activity until you finish treatment. ? Do not douche. ? Avoid alcohol as directed by your health care provider. ? Avoid breastfeeding as directed by your health care provider.  Drink enough water and fluids to keep your urine clear or pale yellow.  Keep the area around your vagina and rectum clean. ? Wash the area daily with warm water. ? Wipe yourself from front to back after using the toilet.  Keep all follow-up visits as told by your health care provider. This is important. How is this prevented?  Do not douche.  Wash the outside of your vagina with warm water only.  Use protection when having sex. This includes latex condoms and dental dams.  Limit how many sexual partners you have. To help prevent bacterial vaginosis, it is best to have sex with just one partner (  monogamous).  Make sure you and your sexual partner are tested for STIs.  Wear cotton or cotton-lined underwear.  Avoid wearing tight pants and pantyhose, especially during summer.  Limit the amount of alcohol that you drink.  Do not use any products that contain  nicotine or tobacco, such as cigarettes and e-cigarettes. If you need help quitting, ask your health care provider.  Do not use illegal drugs. Where to find more information  Centers for Disease Control and Prevention: www.cdc.gov/std  American Sexual Health Association (ASHA): www.ashastd.org  [place] Department of Health and Human Services, Office on Women's Health: www.womenshealth.gov/ or https://www.womenshealth.gov/a-z-topics/bacterial-vaginosis Contact a health care provider if:  Your symptoms do not improve, even after treatment.  You have more discharge or pain when urinating.  You have a fever.  You have pain in your abdomen.  You have pain during sex.  You have vaginal bleeding between periods. Summary  Bacterial vaginosis is a vaginal infection that occurs when the normal balance of bacteria in the vagina is disrupted.  Because bacterial vaginosis increases your risk for STIs (sexually transmitted infections), getting treated can help reduce your risk for chlamydia, gonorrhea, herpes, and HIV (human immunodeficiency virus). Treatment is also important for preventing complications in pregnant women, because the condition can cause an early (premature) delivery.  This condition is treated with antibiotic medicines. These may be given as a pill, a vaginal cream, or a medicine that is put into the vagina (suppository). This information is not intended to replace advice given to you by your health care provider. Make sure you discuss any questions you have with your health care provider. Document Revised: 09/12/2017 Document Reviewed: 06/15/2016 Elsevier Patient Education  2020 [employer]  

## 2020-08-30 ENCOUNTER — Other Ambulatory Visit: Payer: Self-pay | Admitting: Obstetrics

## 2020-08-30 ENCOUNTER — Telehealth: Payer: Self-pay

## 2020-08-30 LAB — CERVICOVAGINAL ANCILLARY ONLY
Bacterial Vaginitis (gardnerella): POSITIVE — AB
Candida Glabrata: NEGATIVE
Candida Vaginitis: NEGATIVE
Chlamydia: NEGATIVE
Comment: NEGATIVE
Comment: NEGATIVE
Comment: NEGATIVE
Comment: NEGATIVE
Comment: NEGATIVE
Comment: NORMAL
Neisseria Gonorrhea: NEGATIVE
Trichomonas: NEGATIVE

## 2020-08-30 LAB — HIV ANTIBODY (ROUTINE TESTING W REFLEX): HIV Screen 4th Generation wRfx: NONREACTIVE

## 2020-08-30 LAB — RPR: RPR Ser Ql: NONREACTIVE

## 2020-08-30 LAB — HEPATITIS C ANTIBODY: Hep C Virus Ab: <0.1 s/co ratio (ref 0.0–0.9)

## 2020-08-30 LAB — HEPATITIS B SURFACE ANTIGEN: Hepatitis B Surface Ag: NEGATIVE

## 2020-08-30 NOTE — Telephone Encounter (Signed)
-----   Message from Brock Bad, MD sent at 08/30/2020  2:03 PM EST ----- Flagyl Rx for BV

## 2020-08-30 NOTE — Telephone Encounter (Signed)
Called patient regarding lab results and medication that was sent to the pharmacy. LMTCB

## 2020-09-04 LAB — CYTOLOGY - PAP
Comment: NEGATIVE
Comment: NEGATIVE
Diagnosis: NEGATIVE
HPV 16: NEGATIVE
HPV 18 / 45: NEGATIVE
High risk HPV: POSITIVE — AB

## 2020-11-15 ENCOUNTER — Other Ambulatory Visit (HOSPITAL_COMMUNITY)
Admission: RE | Admit: 2020-11-15 | Discharge: 2020-11-15 | Disposition: A | Discharge status: Home / Self Care | Payer: Managed Care, Other (non HMO) | Source: Ambulatory Visit | Attending: Obstetrics | Admitting: Obstetrics

## 2020-11-15 ENCOUNTER — Ambulatory Visit (INDEPENDENT_AMBULATORY_CARE_PROVIDER_SITE_OTHER): Payer: Managed Care, Other (non HMO)

## 2020-11-15 ENCOUNTER — Other Ambulatory Visit: Payer: Self-pay

## 2020-11-15 VITALS — BP 115/80 | HR 89 | Wt 150.0 lb

## 2020-11-15 DIAGNOSIS — N898 Other specified noninflammatory disorders of vagina: Secondary | ICD-10-CM | POA: Insufficient documentation

## 2020-11-15 NOTE — Progress Notes (Signed)
SUBJECTIVE:  25 y.o. female complains of white vaginal discharge,odor, itching for 6 day(s). Denies abnormal vaginal bleeding or significant pelvic pain or fever. No UTI symptoms. Denies history of known exposure to STD.  No LMP recorded. Patient has had an implant.  OBJECTIVE:  She appears well, afebrile. Urine dipstick: not done.  ASSESSMENT:  Vaginal Discharge  Vaginal Odor   PLAN:  GC, chlamydia, trichomonas, BVAG, CVAG probe sent to lab. Treatment: To be determined once lab results are received ROV prn if symptoms persist or worsen.

## 2020-11-16 ENCOUNTER — Other Ambulatory Visit: Payer: Self-pay | Admitting: Obstetrics

## 2020-11-16 DIAGNOSIS — B9689 Other specified bacterial agents as the cause of diseases classified elsewhere: Secondary | ICD-10-CM

## 2020-11-16 DIAGNOSIS — B373 Candidiasis of vulva and vagina: Secondary | ICD-10-CM

## 2020-11-16 DIAGNOSIS — B3731 Acute candidiasis of vulva and vagina: Secondary | ICD-10-CM

## 2020-11-16 LAB — CERVICOVAGINAL ANCILLARY ONLY
Bacterial Vaginitis (gardnerella): POSITIVE — AB
Candida Glabrata: NEGATIVE
Candida Vaginitis: POSITIVE — AB
Chlamydia: NEGATIVE
Comment: NEGATIVE
Comment: NEGATIVE
Comment: NEGATIVE
Comment: NEGATIVE
Comment: NEGATIVE
Comment: NORMAL
Neisseria Gonorrhea: NEGATIVE
Trichomonas: NEGATIVE

## 2020-11-16 MED ORDER — FLUCONAZOLE 150 MG PO TABS: 150.0000 mg | ORAL_TABLET | Freq: Once | ORAL | 0 refills | Status: AC

## 2020-11-16 MED ORDER — METRONIDAZOLE 500 MG PO TABS: 500.0000 mg | ORAL_TABLET | Freq: Two times a day (BID) | ORAL | 0 refills | Status: DC

## 2021-02-12 ENCOUNTER — Other Ambulatory Visit: Payer: Self-pay

## 2021-02-12 MED ORDER — FLUCONAZOLE 150 MG PO TABS: 150.0000 mg | ORAL_TABLET | Freq: Once | ORAL | 3 refills | Status: AC

## 2021-02-12 NOTE — Progress Notes (Signed)
Pt called stating she is having vaginal itching and discharge. Denies any odor or burning. Requests rx for diflucan. Rx sent to patient's pharmacy.

## 2021-03-06 ENCOUNTER — Other Ambulatory Visit (HOSPITAL_COMMUNITY)
Admission: RE | Admit: 2021-03-06 | Discharge: 2021-03-06 | Disposition: A | Discharge status: Home / Self Care | Payer: Self-pay | Source: Ambulatory Visit | Attending: Obstetrics | Admitting: Obstetrics

## 2021-03-06 ENCOUNTER — Other Ambulatory Visit: Payer: Self-pay

## 2021-03-06 ENCOUNTER — Ambulatory Visit (INDEPENDENT_AMBULATORY_CARE_PROVIDER_SITE_OTHER): Payer: Self-pay

## 2021-03-06 VITALS — BP 114/72 | HR 79 | Ht 59.0 in | Wt 159.0 lb

## 2021-03-06 DIAGNOSIS — N898 Other specified noninflammatory disorders of vagina: Secondary | ICD-10-CM | POA: Insufficient documentation

## 2021-03-06 NOTE — Progress Notes (Signed)
SUBJECTIVE:  25 y.o. female complains of white vaginal discharge for 2 week(s). Denies abnormal vaginal bleeding or significant pelvic pain or fever. No UTI symptoms. Denies history of known exposure to STD.  No LMP recorded. Patient has had an implant.  OBJECTIVE:  She appears well, afebrile. Urine dipstick: not done.  ASSESSMENT:  Vaginal Discharge  Vaginal Odor   PLAN:  GC, chlamydia, trichomonas, BVAG, CVAG probe sent to lab. Treatment: To be determined once lab results are received ROV prn if symptoms persist or worsen.

## 2021-03-07 LAB — CERVICOVAGINAL ANCILLARY ONLY
Bacterial Vaginitis (gardnerella): POSITIVE — AB
Candida Glabrata: NEGATIVE
Candida Vaginitis: NEGATIVE
Chlamydia: NEGATIVE
Comment: NEGATIVE
Comment: NEGATIVE
Comment: NEGATIVE
Comment: NEGATIVE
Comment: NEGATIVE
Comment: NORMAL
Neisseria Gonorrhea: NEGATIVE
Trichomonas: NEGATIVE

## 2021-03-08 ENCOUNTER — Other Ambulatory Visit: Payer: Self-pay | Admitting: Obstetrics and Gynecology

## 2021-03-08 MED ORDER — METRONIDAZOLE 500 MG PO TABS: 500.0000 mg | ORAL_TABLET | Freq: Two times a day (BID) | ORAL | 0 refills | Status: DC

## 2021-04-29 ENCOUNTER — Ambulatory Visit (HOSPITAL_COMMUNITY)
Admission: EM | Admit: 2021-04-29 | Discharge: 2021-04-29 | Disposition: A | Discharge status: Home / Self Care | Payer: Self-pay | Attending: Internal Medicine | Admitting: Internal Medicine

## 2021-04-29 ENCOUNTER — Other Ambulatory Visit: Payer: Self-pay

## 2021-04-29 ENCOUNTER — Encounter (HOSPITAL_COMMUNITY): Payer: Self-pay | Admitting: *Deleted

## 2021-04-29 DIAGNOSIS — J029 Acute pharyngitis, unspecified: Secondary | ICD-10-CM

## 2021-04-29 DIAGNOSIS — F17210 Nicotine dependence, cigarettes, uncomplicated: Secondary | ICD-10-CM | POA: Insufficient documentation

## 2021-04-29 DIAGNOSIS — R6889 Other general symptoms and signs: Secondary | ICD-10-CM

## 2021-04-29 DIAGNOSIS — U071 COVID-19: Secondary | ICD-10-CM | POA: Insufficient documentation

## 2021-04-29 DIAGNOSIS — R509 Fever, unspecified: Secondary | ICD-10-CM

## 2021-04-29 DIAGNOSIS — Z978 Presence of other specified devices: Secondary | ICD-10-CM | POA: Insufficient documentation

## 2021-04-29 DIAGNOSIS — J069 Acute upper respiratory infection, unspecified: Secondary | ICD-10-CM

## 2021-04-29 LAB — POCT RAPID STREP A, ED / UC: Streptococcus, Group A Screen (Direct): NEGATIVE

## 2021-04-29 LAB — POC INFLUENZA A AND B ANTIGEN (URGENT CARE ONLY)
INFLUENZA A ANTIGEN, POC: NEGATIVE
INFLUENZA B ANTIGEN, POC: NEGATIVE

## 2021-04-29 NOTE — Discharge Instructions (Signed)
You likely having a viral upper respiratory infection. We recommended symptom control. I expect your symptoms to start improving in the next 1-2 weeks.   1. Take a daily allergy pill/anti-histamine like Zyrtec, Claritin, or Store brand consistently for 2 weeks  2. For congestion you may try an oral decongestant like Mucinex or sudafed. You may also try intranasal flonase nasal spray or saline irrigations (neti pot, sinus cleanse)  3. For your sore throat you may try cepacol lozenges, salt water gargles, throat spray. Treatment of congestion may also help your sore throat.  4. For cough you may try Robitussen, Mucinex DM  5. Take Tylenol or Ibuprofen to help with pain/inflammation  6. Stay hydrated, drink plenty of fluids to keep throat coated and less irritated  Honey Tea For cough/sore throat try using a honey-based tea. Use 3 teaspoons of honey with juice squeezed from half lemon. Place shaved pieces of ginger into 1/2-1 cup of water and warm over stove top. Then mix the ingredients and repeat every 4 hours as needed.   Your rapid flu and strep throat tests were negative today. Throat culture and covid 19 viral swab are pending. We will call if these are positive.

## 2021-04-29 NOTE — ED Triage Notes (Signed)
Pt reports body aches and HA starting yesterday.

## 2021-04-29 NOTE — ED Provider Notes (Signed)
[facility]    CSN: [id number] Arrival date & time: 04/29/21  1543      History   Chief Complaint Chief Complaint  Patient presents with   Headache   Generalized Body Aches    HPI Elizabeth Coleman is a 25 y.o. female.   Patient presents to the urgent care for a 2 day history of body aches, headache, and cough. Patient was exposed to Covid 19 briefly at work. Denies any known fevers at home. Denies any upper respiratory symptoms or sore throat.    Headache  Past Medical History:  Diagnosis Date   Chlamydia    Trichomonal infection     Patient Active Problem List   Diagnosis Date Noted   Chlamydia trachomatis infection of lower genitourinary sites 01/05/2014   Screening examination for venereal disease 10/28/2013   History of chlamydia infection 09/01/2013   Urinary tract infection, site not specified 07/08/2013   Vaginitis and vulvovaginitis, unspecified 07/08/2013    History reviewed. No pertinent surgical history.  OB History     Gravida  0   Para  0   Term  0   Preterm  0   AB  0   Living  0      SAB  0   IAB  0   Ectopic  0   Multiple  0   Live Births               Home Medications    Prior to Admission medications   Medication Sig Start Date End Date Taking? Authorizing Provider  etonogestrel (NEXPLANON) 68 MG IMPL implant 1 each by Subdermal route once.    [provider]  metroNIDAZOLE (FLAGYL) 500 MG tablet Take 1 tablet (500 mg total) by mouth 2 (two) times daily. 03/08/21   Constant, Peggy, MD    Family History Family History  Problem Relation Age of Onset   Heart disease Maternal Grandmother    Cancer Maternal Grandfather    Hypertension Other    Diabetes Other    Cancer Other     Social History Social History   Tobacco Use   Smoking status: Light Smoker    Packs/day: 0.25    Types: Cigarettes   Smokeless tobacco: Never   Tobacco comments:    trying to stop   Vaping Use   Vaping Use:  Never used  Substance Use Topics   Alcohol use: Yes    Alcohol/week: 0.0 standard drinks    Comment: occ   Drug use: No    Frequency: 1.0 times per week    Types: Marijuana     Allergies   Patient has no known allergies.   Review of Systems Review of Systems Per HPI  Physical Exam Triage Vital Signs ED Triage Vitals [04/29/21 1616]  Enc Vitals Group     BP 115/75     Pulse Rate (!) 101     Resp 20     Temp 99.9 F (37.7 C)     Temp src      SpO2 100 %     Weight      Height      Head Circumference      Peak Flow      Pain Score 8     Pain Loc      Pain Edu?      Excl. in GC?    No data found.  Updated Vital Signs BP 115/75   Pulse (!) 101  Temp 99.9 F (37.7 C)   Resp 20   SpO2 100%   Visual Acuity Right Eye Distance:   Left Eye Distance:   Bilateral Distance:    Right Eye Near:   Left Eye Near:    Bilateral Near:     Physical Exam Constitutional:      General: She is not in acute distress.    Appearance: Normal appearance.  HENT:     Head: Normocephalic and atraumatic.     Right Ear: Tympanic membrane and ear canal normal.     Left Ear: Tympanic membrane and ear canal normal.     Nose: Congestion present.     Right Turbinates: Enlarged.     Left Turbinates: Enlarged.     Mouth/Throat:     Mouth: Mucous membranes are moist.     Pharynx: Posterior oropharyngeal erythema present.  Eyes:     Extraocular Movements: Extraocular movements intact.     Conjunctiva/sclera: Conjunctivae normal.     Pupils: Pupils are equal, round, and reactive to light.  Cardiovascular:     Rate and Rhythm: Normal rate and regular rhythm.     Pulses: Normal pulses.     Heart sounds: Normal heart sounds.  Pulmonary:     Effort: Pulmonary effort is normal. No respiratory distress.     Breath sounds: Normal breath sounds. No wheezing.  Abdominal:     General: Abdomen is flat. Bowel sounds are normal.     Palpations: Abdomen is soft.  Musculoskeletal:         General: Normal range of motion.     Cervical back: Normal range of motion.  Skin:    General: Skin is warm and dry.  Neurological:     General: No focal deficit present.     Mental Status: She is alert and oriented to person, place, and time. Mental status is at baseline.  Psychiatric:        Mood and Affect: Mood normal.        Behavior: Behavior normal.     UC Treatments / Results  Labs (all labs ordered are listed, but only abnormal results are displayed) Labs Reviewed  CULTURE, GROUP A STREP ([facility])  SARS CORONAVIRUS 2 (TAT 6-24 HRS)  POCT RAPID STREP A, ED / UC  POC INFLUENZA A AND B ANTIGEN (URGENT CARE ONLY)    EKG   Radiology No results found.  Procedures Procedures (including critical care time)  Medications Ordered in UC Medications - No data to display  Initial Impression / Assessment and Plan / UC Course  I have reviewed the triage vital signs and the nursing notes.  Pertinent labs & imaging results that were available during my care of the patient were reviewed by me and considered in my medical decision making (see chart for details).     Patient presents with symptoms likely from a viral upper respiratory infection. Differential includes bacterial pneumonia, sinusitis, allergic rhinitis, Covid 19. Do not suspect underlying cardiopulmonary process. Symptoms seem unlikely related to ACS, CHF or COPD exacerbations, pneumonia, pneumothorax. Patient is nontoxic appearing and not in need of emergent medical intervention.  Recommended symptom control with over the counter medications: Daily oral anti-histamine, Oral decongestant or IN corticosteroid, saline irrigations, cepacol lozenges, Robitussin, Delsym, honey tea.  Rapid flu and strep throat tests were negative today. Throat culture and covid 19 tests pending.   Return if symptoms fail to improve in 1-2 weeks or you develop shortness of breath, chest pain, severe headache. Patient  states understanding and  is agreeable.  Discharged with PCP or urgent care followup.  Final Clinical Impressions(s) / UC Diagnoses   Final diagnoses:  Viral upper respiratory tract infection  Flu-like symptoms  Acute pharyngitis, unspecified etiology  Fever, unspecified     Discharge Instructions      You likely having a viral upper respiratory infection. We recommended symptom control. I expect your symptoms to start improving in the next 1-2 weeks.   1. Take a daily allergy pill/anti-histamine like Zyrtec, Claritin, or Store brand consistently for 2 weeks  2. For congestion you may try an oral decongestant like Mucinex or sudafed. You may also try intranasal flonase nasal spray or saline irrigations (neti pot, sinus cleanse)  3. For your sore throat you may try cepacol lozenges, salt water gargles, throat spray. Treatment of congestion may also help your sore throat.  4. For cough you may try Robitussen, Mucinex DM  5. Take Tylenol or Ibuprofen to help with pain/inflammation  6. Stay hydrated, drink plenty of fluids to keep throat coated and less irritated  Honey Tea For cough/sore throat try using a honey-based tea. Use 3 teaspoons of honey with juice squeezed from half lemon. Place shaved pieces of ginger into 1/2-1 cup of water and warm over stove top. Then mix the ingredients and repeat every 4 hours as needed.   Your rapid flu and strep throat tests were negative today. Throat culture and covid 19 viral swab are pending. We will call if these are positive.      ED Prescriptions   None    PDMP not reviewed this encounter.   Lance Muss, FNP 04/29/21 [phone]

## 2021-04-30 LAB — SARS CORONAVIRUS 2 (TAT 6-24 HRS): SARS Coronavirus 2: POSITIVE — AB

## 2021-05-02 LAB — CULTURE, GROUP A STREP (THRC)

## 2021-05-03 NOTE — Progress Notes (Signed)
Attempted to call patient in regards to test results and to inquire about symptoms. No answer. LVM.

## 2021-06-11 IMAGING — CT CT RENAL STONE PROTOCOL
2 of 4 series · 17 of 46 positions shown, 19 images · non-contrast
Comparison: 11/05/2005

CLINICAL DATA: Dysuria for 1 week with low back pain.

EXAM:
CT ABDOMEN AND PELVIS WITHOUT CONTRAST
TECHNIQUE: Multidetector CT imaging of the abdomen and pelvis was performed
following the standard protocol without IV contrast.

[Series 3: renal stone 5.0 · axial · 0.87mm/px · z∈[+850,+1235]mm · 14 of 85 slices shown, 16 images]
[im 4/85  soft-tissue]
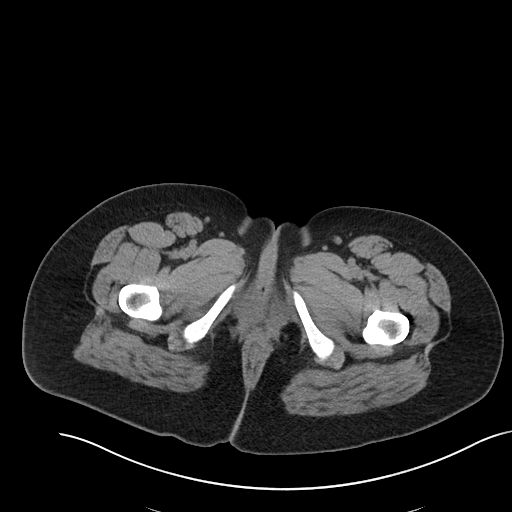
[im 4/85  bone]
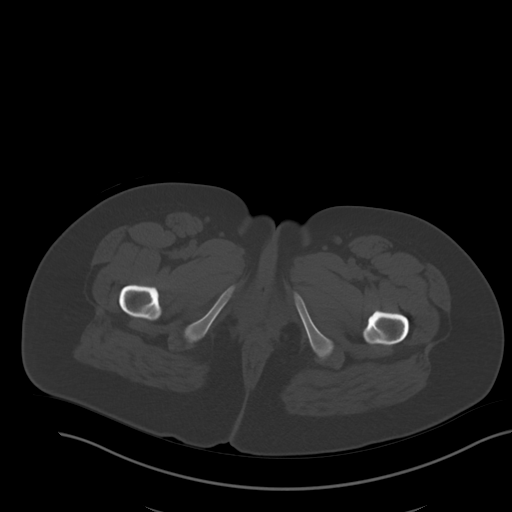
[im 11/85  soft-tissue]
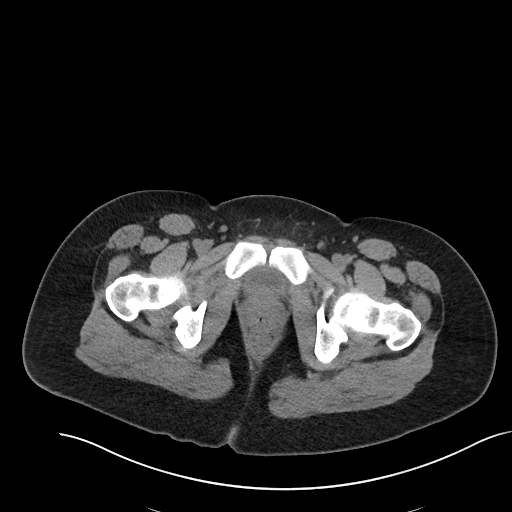
[im 18/85  soft-tissue]
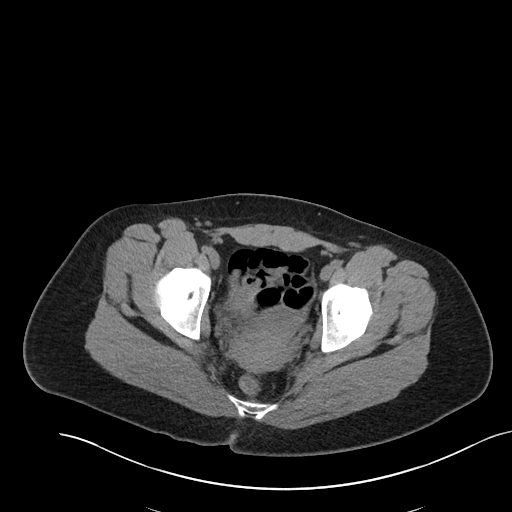
[im 22/85  soft-tissue]
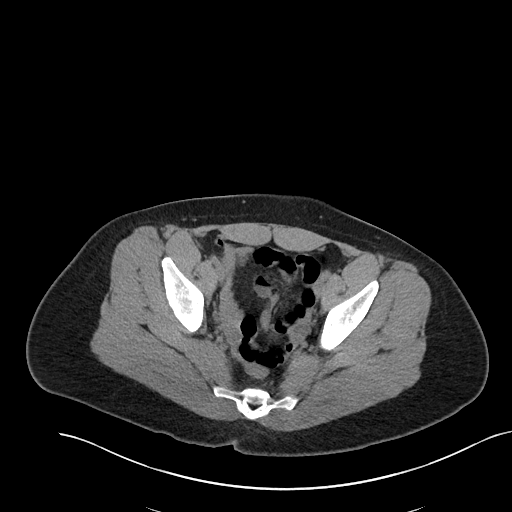
[im 29/85  soft-tissue]
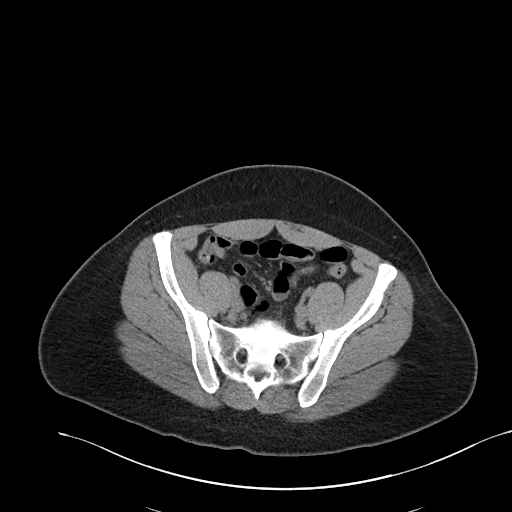
[im 36/85  soft-tissue]
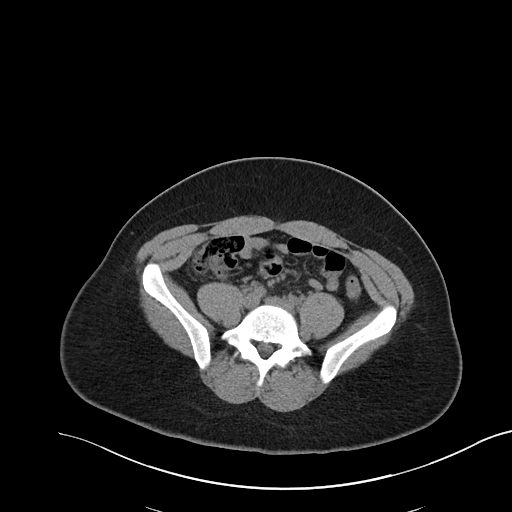
[im 39/85  soft-tissue]
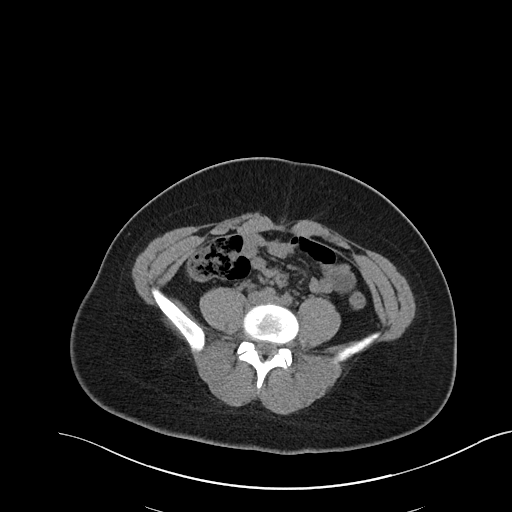
[im 46/85  soft-tissue]
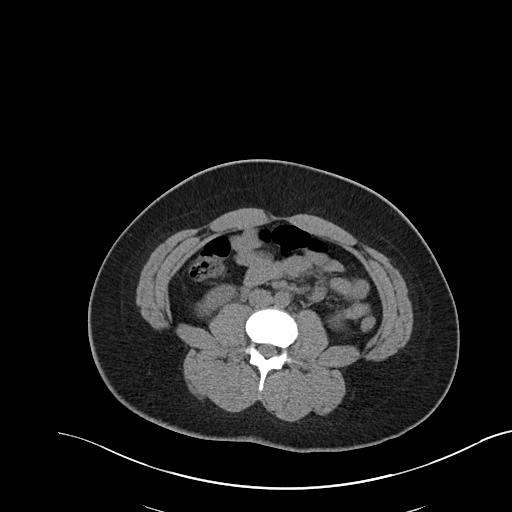
[im 50/85  soft-tissue]
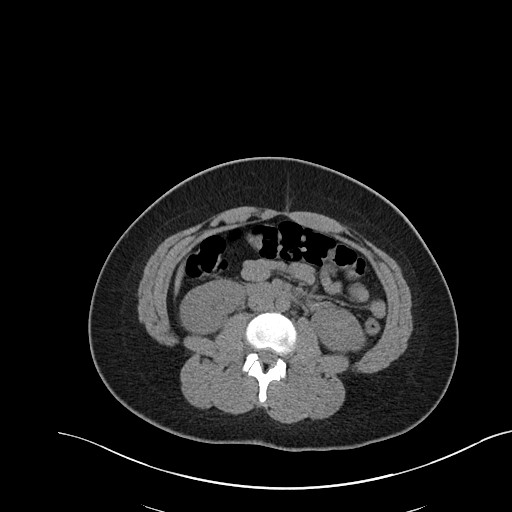
[im 50/85  bone]
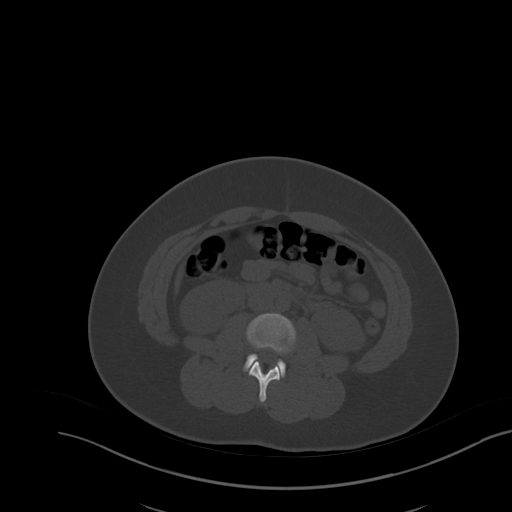
[im 57/85  soft-tissue]
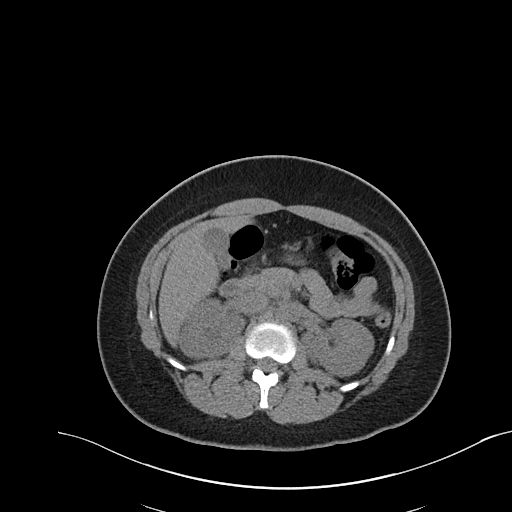
[im 64/85  soft-tissue]
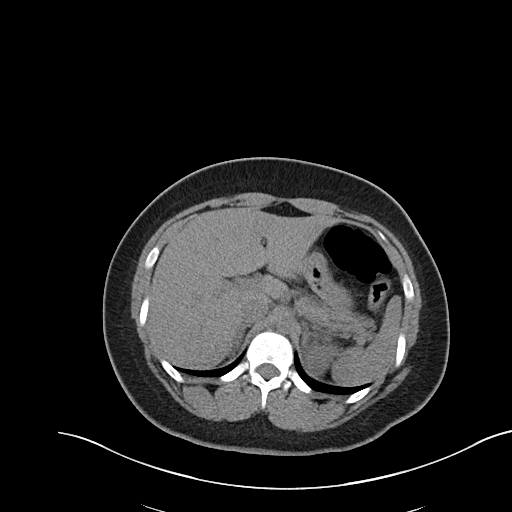
[im 67/85  soft-tissue]
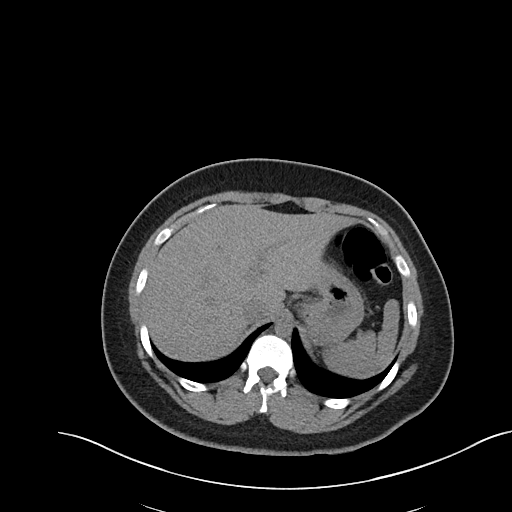
[im 74/85  soft-tissue]
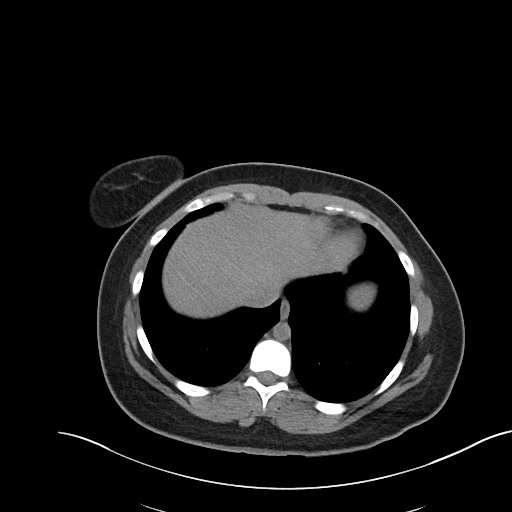
[im 81/85  soft-tissue]
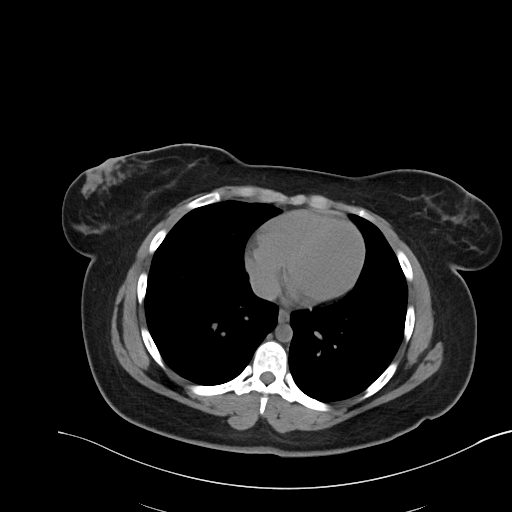

[Series 6: renal stone 3.0 cor · coronal · 0.69mm/px · 3 of 83 slices shown]
[im 28/83  soft-tissue]
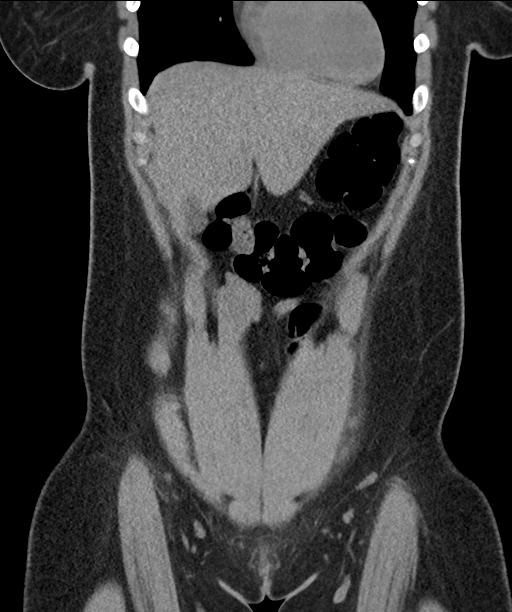
[im 37/83  soft-tissue]
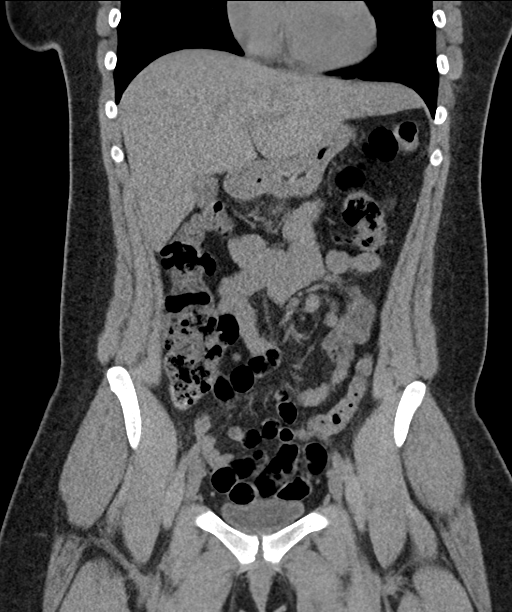
[im 46/83  soft-tissue]
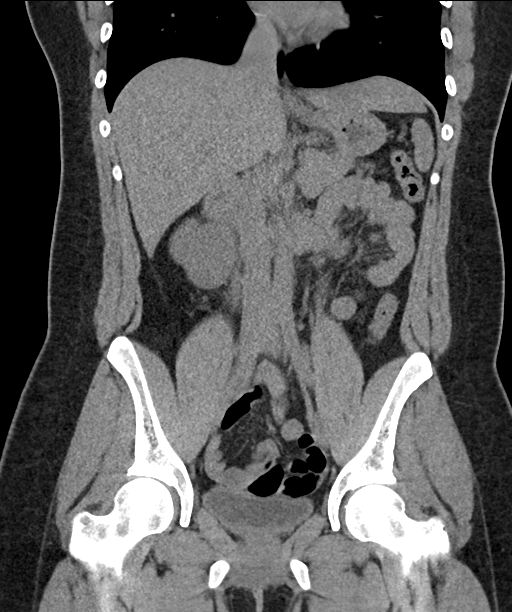

[17 of 46 positions shown; findings below may reference images not displayed]

FINDINGS: Lower chest: Clear lung bases.

Hepatobiliary: No focal liver abnormality is seen. No gallstones,
gallbladder wall thickening, or biliary dilatation.

Pancreas: Unremarkable.

Spleen: Unremarkable.

Adrenals/Urinary Tract: Unremarkable adrenal glands. No evidence of
renal calculi, hydronephrosis, or mass on this unenhanced study.
Unremarkable bladder.

Stomach/Bowel: The stomach is nondistended. There is no evidence of
bowel obstruction or inflammation. The appendix contains gas and is
unremarkable.

Vascular/Lymphatic: No significant vascular findings are present. No
enlarged abdominal or pelvic lymph nodes.

Reproductive: Uterus and bilateral adnexa are unremarkable.

Other: No ascites. No abdominal wall hernia.

Musculoskeletal: No acute osseous abnormality or suspicious osseous
lesion.
IMPRESSION: No evidence of urinary tract calculi, obstruction, or other acute
abnormality.

## 2021-08-30 ENCOUNTER — Encounter (HOSPITAL_COMMUNITY): Payer: Self-pay | Admitting: Emergency Medicine

## 2021-08-30 ENCOUNTER — Ambulatory Visit (HOSPITAL_COMMUNITY)
Admission: EM | Admit: 2021-08-30 | Discharge: 2021-08-30 | Disposition: A | Discharge status: Home / Self Care | Payer: Self-pay | Attending: Internal Medicine | Admitting: Internal Medicine

## 2021-08-30 ENCOUNTER — Other Ambulatory Visit: Payer: Self-pay

## 2021-08-30 DIAGNOSIS — F17210 Nicotine dependence, cigarettes, uncomplicated: Secondary | ICD-10-CM | POA: Insufficient documentation

## 2021-08-30 DIAGNOSIS — J111 Influenza due to unidentified influenza virus with other respiratory manifestations: Secondary | ICD-10-CM | POA: Insufficient documentation

## 2021-08-30 DIAGNOSIS — R052 Subacute cough: Secondary | ICD-10-CM | POA: Insufficient documentation

## 2021-08-30 DIAGNOSIS — R52 Pain, unspecified: Secondary | ICD-10-CM | POA: Insufficient documentation

## 2021-08-30 DIAGNOSIS — B349 Viral infection, unspecified: Secondary | ICD-10-CM | POA: Insufficient documentation

## 2021-08-30 DIAGNOSIS — Z20822 Contact with and (suspected) exposure to covid-19: Secondary | ICD-10-CM | POA: Insufficient documentation

## 2021-08-30 LAB — RESPIRATORY PANEL BY PCR

## 2021-08-30 MED ORDER — FLUTICASONE PROPIONATE 50 MCG/ACT NA SUSP: 2.0000 | Freq: Every day | NASAL | 12 refills | Status: DC

## 2021-08-30 MED ORDER — PSEUDOEPHEDRINE HCL 30 MG PO TABS: 30.0000 mg | ORAL_TABLET | Freq: Three times a day (TID) | ORAL | 0 refills | Status: DC | PRN

## 2021-08-30 MED ORDER — PROMETHAZINE-DM 6.25-15 MG/5ML PO SYRP: 5.0000 mL | ORAL_SOLUTION | Freq: Every evening | ORAL | 0 refills | Status: DC | PRN

## 2021-08-30 MED ORDER — OSELTAMIVIR PHOSPHATE 75 MG PO CAPS: 75.0000 mg | ORAL_CAPSULE | Freq: Two times a day (BID) | ORAL | 0 refills | Status: DC

## 2021-08-30 MED ORDER — BENZONATATE 100 MG PO CAPS
100.0000 mg | ORAL_CAPSULE | Freq: Three times a day (TID) | ORAL | 0 refills | Status: DC | PRN
Start: 2021-08-30 — End: 2024-08-16

## 2021-08-30 MED ORDER — CETIRIZINE HCL 10 MG PO TABS: 10.0000 mg | ORAL_TABLET | Freq: Every day | ORAL | 0 refills | Status: DC

## 2021-08-30 NOTE — ED Triage Notes (Signed)
Pt c/o headache for 2 days with cough and body aches.

## 2021-08-30 NOTE — ED Provider Notes (Signed)
[facility]   MRN: [id number] DOB: [birth year]  Subjective:   Elizabeth Coleman is a 25 y.o. female presenting for 2-day history of body aches, fever, coughing, malaise and fatigue.  No chest pain, shortness of breath.  Patient is a light smoker.  No history of asthma or respiratory disorders.  No current facility-administered medications for this encounter.  Current Outpatient Medications:    etonogestrel (NEXPLANON) 68 MG IMPL implant, 1 each by Subdermal route once., Disp: , Rfl:    metroNIDAZOLE (FLAGYL) 500 MG tablet, Take 1 tablet (500 mg total) by mouth 2 (two) times daily., Disp: 14 tablet, Rfl: 0   No Known Allergies  Past Medical History:  Diagnosis Date   Chlamydia    Trichomonal infection      History reviewed. No pertinent surgical history.  Family History  Problem Relation Age of Onset   Heart disease Maternal Grandmother    Cancer Maternal Grandfather    Hypertension Other    Diabetes Other    Cancer Other     Social History   Tobacco Use   Smoking status: Light Smoker    Packs/day: 0.25    Types: Cigarettes   Smokeless tobacco: Never   Tobacco comments:    trying to stop   Vaping Use   Vaping Use: Never used  Substance Use Topics   Alcohol use: Yes    Alcohol/week: 0.0 standard drinks    Comment: occ   Drug use: No    Frequency: 1.0 times per week    Types: Marijuana    ROS   Objective:   Vitals: BP 108/74 (BP Location: Right Arm)   Pulse 97   Temp 99.7 F (37.6 C) (Oral)   Resp 17   SpO2 97%   Physical Exam Constitutional:      General: She is not in acute distress.    Appearance: Normal appearance. She is well-developed and normal weight. She is not ill-appearing, toxic-appearing or diaphoretic.  HENT:     Head: Normocephalic and atraumatic.     Right Ear: External ear normal.     Left Ear: External ear normal.     Nose: Nose normal.     Mouth/Throat:     Mouth: Mucous membranes are moist.  Eyes:      General: No scleral icterus.       Right eye: No discharge.        Left eye: No discharge.     Extraocular Movements: Extraocular movements intact.     Conjunctiva/sclera: Conjunctivae normal.     Pupils: Pupils are equal, round, and reactive to light.  Neck:     Meningeal: Brudzinski's sign and Kernig's sign absent.  Cardiovascular:     Rate and Rhythm: Normal rate and regular rhythm.     Pulses: Normal pulses.     Heart sounds: Normal heart sounds. No murmur heard.   No friction rub. No gallop.  Pulmonary:     Effort: Pulmonary effort is normal. No respiratory distress.     Breath sounds: Normal breath sounds. No stridor. No wheezing, rhonchi or rales.  Musculoskeletal:     Cervical back: Normal range of motion and neck supple. No rigidity.  Lymphadenopathy:     Cervical: No cervical adenopathy.  Skin:    General: Skin is warm and dry.     Findings: No rash.  Neurological:     Mental Status: She is alert and oriented to person, place, and time.  Psychiatric:  Mood and Affect: Mood normal.        Behavior: Behavior normal.        Thought Content: Thought content normal.        Judgment: Judgment normal.    Assessment and Plan :   PDMP not reviewed this encounter.  1. Acute viral syndrome   2. Subacute cough   3. Body aches    Will cover for influenza with Tamiflu given low grade fever, current incidence in the community, symptom set, current incidence in the community.  Use supportive care, rest, fluids, hydration, light meals, schedule Tylenol and ibuprofen. Counseled patient on potential for adverse effects with medications prescribed today, patient verbalized understanding. ER and return-to-clinic precautions discussed, patient verbalized understanding.    Wallis Bamberg, PA-C 08/30/21 1054

## 2021-08-31 LAB — SARS CORONAVIRUS 2 (TAT 6-24 HRS): SARS Coronavirus 2: NEGATIVE

## 2022-03-06 ENCOUNTER — Other Ambulatory Visit: Payer: Self-pay

## 2022-03-06 ENCOUNTER — Emergency Department (HOSPITAL_COMMUNITY)
Admission: EM | Admit: 2022-03-06 | Discharge: 2022-03-06 | Disposition: A | Discharge status: Home / Self Care | Payer: Self-pay | Attending: Emergency Medicine | Admitting: Emergency Medicine

## 2022-03-06 ENCOUNTER — Ambulatory Visit: Payer: Self-pay

## 2022-03-06 ENCOUNTER — Encounter (HOSPITAL_COMMUNITY): Payer: Self-pay | Admitting: Emergency Medicine

## 2022-03-06 DIAGNOSIS — K029 Dental caries, unspecified: Secondary | ICD-10-CM | POA: Insufficient documentation

## 2022-03-06 DIAGNOSIS — K0889 Other specified disorders of teeth and supporting structures: Secondary | ICD-10-CM

## 2022-03-06 MED ORDER — KETOROLAC TROMETHAMINE 30 MG/ML IJ SOLN
30.0000 mg | Freq: Once | INTRAMUSCULAR | Status: AC
  Administered 2022-03-06: 30 mg via INTRAMUSCULAR
  Filled 2022-03-06: qty 1

## 2022-03-06 NOTE — Discharge Instructions (Addendum)
Continue with the antibiotic and ibuprofen you started yesterday. You may add tylenol, [id number] mg, 4 hours after the ibuprofen as discussed. Do not exceed 4 grams of tylenol per day. Follow-up as recommended by your dentist.

## 2022-03-06 NOTE — ED Provider Notes (Signed)
[facility] EMERGENCY DEPARTMENT Provider Note   CSN: [id number] Arrival date & time: 03/06/22  1515     History  Chief Complaint  Patient presents with   Dental Pain    Elizabeth Coleman is a 26 y.o. female.  Patient presents to the ED today with complaint of dental pain. She has a cavity to the left upper cuspid. She was seen by a dentist yesterday, started on antibiotics and ibuprofen, and was told she needed a root canal. She does not have dental insurance. She is writhing about on the bed during evaluation.  The history is provided by the patient. No language interpreter was used.  Dental Pain Location:  Upper Upper teeth location:  11/LU cuspid Quality:  Pulsating, throbbing and constant Severity:  Severe Duration:  2 days Timing:  Constant Progression:  Worsening Chronicity:  New Context: dental caries   Previous work-up:  Dental exam Ineffective treatments:  NSAIDs and heat Associated symptoms: facial pain   Associated symptoms: no difficulty swallowing, no fever and no trismus       Home Medications Prior to Admission medications   Medication Sig Start Date End Date Taking? Authorizing Provider  benzonatate (TESSALON) 100 MG capsule Take 1-2 capsules (100-200 mg total) by mouth 3 (three) times daily as needed for cough. 08/30/21   Wallis Bamberg, PA-C  cetirizine (ZYRTEC ALLERGY) 10 MG tablet Take 1 tablet (10 mg total) by mouth daily. 08/30/21   Wallis Bamberg, PA-C  etonogestrel (NEXPLANON) 68 MG IMPL implant 1 each by Subdermal route once.    [provider]  fluticasone (FLONASE) 50 MCG/ACT nasal spray Place 2 sprays into both nostrils daily. 08/30/21   Wallis Bamberg, PA-C  metroNIDAZOLE (FLAGYL) 500 MG tablet Take 1 tablet (500 mg total) by mouth 2 (two) times daily. 03/08/21   Constant, Peggy, MD  oseltamivir (TAMIFLU) 75 MG capsule Take 1 capsule (75 mg total) by mouth 2 (two) times daily. 08/30/21   Wallis Bamberg, PA-C   promethazine-dextromethorphan (PROMETHAZINE-DM) 6.25-15 MG/5ML syrup Take 5 mLs by mouth at bedtime as needed for cough. 08/30/21   Wallis Bamberg, PA-C  pseudoephedrine (SUDAFED) 30 MG tablet Take 1 tablet (30 mg total) by mouth every 8 (eight) hours as needed for congestion. 08/30/21   Wallis Bamberg, PA-C      Allergies    Patient has no known allergies.    Review of Systems   Review of Systems  Constitutional:  Negative for fever.  All other systems reviewed and are negative.  Physical Exam Updated Vital Signs BP (!) 161/80   Pulse 84   Temp 98.1 F (36.7 C) (Oral)   Resp 20   SpO2 95%  Physical Exam HENT:     Head: Normocephalic.     Nose: Nose normal.     Mouth/Throat:     Mouth: Mucous membranes are moist.      Comments: cavity Eyes:     Conjunctiva/sclera: Conjunctivae normal.  Cardiovascular:     Rate and Rhythm: Normal rate.  Pulmonary:     Effort: Pulmonary effort is normal.  Musculoskeletal:        General: Normal range of motion.     Cervical back: Normal range of motion.  Skin:    General: Skin is warm and dry.  Neurological:     Mental Status: She is alert and oriented to person, place, and time.  Psychiatric:        Mood and Affect: Mood normal.  Behavior: Behavior normal.    ED Results / Procedures / Treatments   Labs (all labs ordered are listed, but only abnormal results are displayed) Labs Reviewed - No data to display  EKG None  Radiology No results found.  Procedures Procedures    Medications Ordered in ED Medications  ketorolac (TORADOL) 30 MG/ML injection 30 mg (30 mg Intramuscular Given 03/06/22 1558)    ED Course/ Medical Decision Making/ A&P                           Medical Decision Making Risk Prescription drug management.   Patient with dentalgia.  No abscess requiring immediate incision and drainage.  Exam not concerning for Ludwig's angina or pharyngeal abscess.  Patient has been seen by dentist, already on  antibiotic and NSAID.Marland Kitchen Pt given toradol IM with relief of discomfort. Discussed return precautions. Pt safe for discharge.         Final Clinical Impression(s) / ED Diagnoses Final diagnoses:  Pain, dental    Rx / DC Orders ED Discharge Orders     None         Felicie Morn, NP 03/06/22 1643    Ernie Avena, MD 03/06/22 1919

## 2022-03-06 NOTE — ED Triage Notes (Signed)
Pt arrives POV with R sided dental pain. Pt has a cavity to R side and reports "I need a route canal". Pt writhing in pain during triage.

## 2022-03-14 ENCOUNTER — Ambulatory Visit: Payer: Self-pay

## 2022-03-19 ENCOUNTER — Ambulatory Visit (INDEPENDENT_AMBULATORY_CARE_PROVIDER_SITE_OTHER): Payer: PRIVATE HEALTH INSURANCE

## 2022-03-19 ENCOUNTER — Other Ambulatory Visit (HOSPITAL_COMMUNITY)
Admission: RE | Admit: 2022-03-19 | Discharge: 2022-03-19 | Disposition: A | Discharge status: Home / Self Care | Payer: PRIVATE HEALTH INSURANCE | Source: Ambulatory Visit | Attending: Obstetrics and Gynecology | Admitting: Obstetrics and Gynecology

## 2022-03-19 ENCOUNTER — Other Ambulatory Visit: Payer: Self-pay | Admitting: Obstetrics

## 2022-03-19 DIAGNOSIS — N898 Other specified noninflammatory disorders of vagina: Secondary | ICD-10-CM

## 2022-03-19 DIAGNOSIS — R52 Pain, unspecified: Secondary | ICD-10-CM

## 2022-03-19 MED ORDER — IBUPROFEN 800 MG PO TABS: 800.0000 mg | ORAL_TABLET | Freq: Three times a day (TID) | ORAL | 5 refills | Status: DC | PRN

## 2022-03-19 NOTE — Progress Notes (Signed)
..  SUBJECTIVE:  26 y.o. female complains of vaginal discharge for 5 day(s). Denies abnormal vaginal bleeding or significant pelvic pain or fever. No UTI symptoms. Denies history of known exposure to STD. Reports new partner  No LMP recorded. Patient has had an implant.  OBJECTIVE:  She appears well, afebrile. Urine dipstick: not done.  ASSESSMENT:  Vaginal Discharge     PLAN:  GC, chlamydia, trichomonas, BVAG, CVAG probe sent to lab. Treatment: To be determined once lab results are received ROV prn if symptoms persist or worsen.

## 2022-03-20 LAB — CERVICOVAGINAL ANCILLARY ONLY
Bacterial Vaginitis (gardnerella): POSITIVE — AB
Candida Glabrata: NEGATIVE
Candida Vaginitis: NEGATIVE
Chlamydia: NEGATIVE
Comment: NEGATIVE
Comment: NEGATIVE
Comment: NEGATIVE
Comment: NEGATIVE
Comment: NEGATIVE
Comment: NORMAL
Neisseria Gonorrhea: NEGATIVE
Trichomonas: NEGATIVE

## 2022-03-20 LAB — HEPATITIS B SURFACE ANTIGEN: Hepatitis B Surface Ag: NEGATIVE

## 2022-03-20 LAB — HIV ANTIBODY (ROUTINE TESTING W REFLEX): HIV Screen 4th Generation wRfx: NONREACTIVE

## 2022-03-20 LAB — HEPATITIS C ANTIBODY: Hep C Virus Ab: NONREACTIVE

## 2022-03-20 LAB — RPR: RPR Ser Ql: NONREACTIVE

## 2022-03-20 NOTE — Progress Notes (Signed)
Agree with nurses's documentation of this patient's clinic encounter.  Ersie Savino L, MD  

## 2022-03-21 ENCOUNTER — Other Ambulatory Visit: Payer: Self-pay

## 2022-03-21 DIAGNOSIS — B9689 Other specified bacterial agents as the cause of diseases classified elsewhere: Secondary | ICD-10-CM

## 2022-03-21 MED ORDER — METRONIDAZOLE 500 MG PO TABS: 500.0000 mg | ORAL_TABLET | Freq: Two times a day (BID) | ORAL | 0 refills | Status: DC

## 2022-04-24 ENCOUNTER — Ambulatory Visit (INDEPENDENT_AMBULATORY_CARE_PROVIDER_SITE_OTHER): Payer: PRIVATE HEALTH INSURANCE | Admitting: Obstetrics and Gynecology

## 2022-04-24 ENCOUNTER — Other Ambulatory Visit (HOSPITAL_COMMUNITY)
Admission: RE | Admit: 2022-04-24 | Discharge: 2022-04-24 | Disposition: A | Discharge status: Home / Self Care | Payer: PRIVATE HEALTH INSURANCE | Source: Ambulatory Visit | Attending: Obstetrics and Gynecology | Admitting: Obstetrics and Gynecology

## 2022-04-24 ENCOUNTER — Ambulatory Visit: Payer: PRIVATE HEALTH INSURANCE | Admitting: Obstetrics

## 2022-04-24 ENCOUNTER — Encounter: Payer: Self-pay | Admitting: Obstetrics and Gynecology

## 2022-04-24 VITALS — BP 104/67 | HR 76 | Ht 59.0 in | Wt 159.0 lb

## 2022-04-24 DIAGNOSIS — Z3046 Encounter for surveillance of implantable subdermal contraceptive: Secondary | ICD-10-CM

## 2022-04-24 DIAGNOSIS — Z113 Encounter for screening for infections with a predominantly sexual mode of transmission: Secondary | ICD-10-CM

## 2022-04-24 DIAGNOSIS — Z01419 Encounter for gynecological examination (general) (routine) without abnormal findings: Secondary | ICD-10-CM | POA: Diagnosis present

## 2022-04-24 LAB — POCT URINE PREGNANCY: Preg Test, Ur: NEGATIVE

## 2022-04-24 MED ORDER — ETONOGESTREL 68 MG ~~LOC~~ IMPL
68.0000 mg | DRUG_IMPLANT | Freq: Once | SUBCUTANEOUS | Status: AC
  Administered 2022-04-24: 68 mg via SUBCUTANEOUS

## 2022-04-24 NOTE — Progress Notes (Signed)
Annual GYN Last Pap 2021, abnormal Nexplanon removal and replacement Requests STI swab, new partner

## 2022-04-24 NOTE — Progress Notes (Signed)
     GYNECOLOGY OFFICE PROCEDURE NOTE  Elizabeth Coleman is a 26 y.o. G0P0000 here for Nexplanon removal and Nexplanon insertion.  Last pap smear was on 08/29/20 and was normal.  No other gynecologic concerns.   Nexplanon Removal and Reinsertion Patient identified, informed consent performed, consent signed.   Patient does understand that irregular bleeding is a very common side effect of this medication. She was advised to have backup contraception for one week after replacement of the implant. Pregnancy test in clinic today was negative.  Appropriate time out taken. Nexplanon site identified in left arm.  Area prepped in usual sterile fashon. One ml of 1% lidocaine was used to anesthetize the area at the distal end of the implant. A small stab incision was made right beside the implant on the distal portion. The Nexplanon rod was grasped using hemostats and removed without difficulty. There was minimal blood loss. There were no complications. Area was then injected with 3 ml of 1 % lidocaine. She was re-prepped with betadine, Nexplanon removed from packaging, Device confirmed in needle, then inserted full length of needle and withdrawn per handbook instructions. Nexplanon was able to palpated in the patient's arm; patient palpated the insert herself.  There was minimal blood loss. Patient insertion site covered with gauze and a pressure bandage to reduce any bruising. The patient tolerated the procedure well and was given post procedure instructions.  She was advised to have backup contraception for one week.      Mariel Aloe, MD, FACOG Obstetrician & Gynecologist, [facility] for [facility], [facility]

## 2022-04-24 NOTE — Progress Notes (Signed)
GYNECOLOGY ANNUAL PREVENTATIVE CARE ENCOUNTER NOTE  History:     Elizabeth Coleman is a 26 y.o. G0P0000 female here for a routine annual gynecologic exam.  Current complaints: need for new nexplanon.   Denies abnormal vaginal bleeding, discharge, pelvic pain, problems with intercourse or other gynecologic concerns.    Gynecologic History No LMP recorded. Patient has had an implant. Contraception: Nexplanon Last Pap: 08/29/20. Results were: normal with positive HPV  Obstetric History OB History  Gravida Para Term Preterm AB Living  0 0 0 0 0 0  SAB IAB Ectopic Multiple Live Births  0 0 0 0      Past Medical History:  Diagnosis Date   Chlamydia    Trichomonal infection     History reviewed. No pertinent surgical history.  Current Outpatient Medications on File Prior to Visit  Medication Sig Dispense Refill   ibuprofen (ADVIL) 800 MG tablet Take 1 tablet (800 mg total) by mouth every 8 (eight) hours as needed. 30 tablet 5   metroNIDAZOLE (FLAGYL) 500 MG tablet Take 1 tablet (500 mg total) by mouth 2 (two) times daily. 14 tablet 0   benzonatate (TESSALON) 100 MG capsule Take 1-2 capsules (100-200 mg total) by mouth 3 (three) times daily as needed for cough. 60 capsule 0   cetirizine (ZYRTEC ALLERGY) 10 MG tablet Take 1 tablet (10 mg total) by mouth daily. 30 tablet 0   fluticasone (FLONASE) 50 MCG/ACT nasal spray Place 2 sprays into both nostrils daily. 16 g 12   metroNIDAZOLE (FLAGYL) 500 MG tablet Take 1 tablet (500 mg total) by mouth 2 (two) times daily. 14 tablet 0   oseltamivir (TAMIFLU) 75 MG capsule Take 1 capsule (75 mg total) by mouth 2 (two) times daily. 10 capsule 0   promethazine-dextromethorphan (PROMETHAZINE-DM) 6.25-15 MG/5ML syrup Take 5 mLs by mouth at bedtime as needed for cough. 100 mL 0   pseudoephedrine (SUDAFED) 30 MG tablet Take 1 tablet (30 mg total) by mouth every 8 (eight) hours as needed for congestion. 30 tablet 0   No current facility-administered  medications on file prior to visit.    No Known Allergies  Social History:  reports that she has been smoking cigarettes. She has been smoking an average of .25 packs per day. She has never used smokeless tobacco. She reports current alcohol use. She reports that she does not use drugs.  Family History  Problem Relation Age of Onset   Heart disease Maternal Grandmother    Cancer Maternal Grandfather    Hypertension Other    Diabetes Other    Cancer Other     The following portions of the patient's history were reviewed and updated as appropriate: allergies, current medications, past family history, past medical history, past social history, past surgical history and problem list.  Review of Systems Pertinent items noted in HPI and remainder of comprehensive ROS otherwise negative.  Physical Exam:  BP 104/67   Pulse 76   Ht 4\' 11"  (1.499 m)   Wt 159 lb (72.1 kg)   BMI 32.11 kg/m  CONSTITUTIONAL: Well-developed, well-nourished female in no acute distress.  HENT:  Normocephalic, atraumatic, External right and left ear normal. Oropharynx is clear and moist EYES: Conjunctivae and EOM are normal.  NECK: Normal range of motion, supple, no masses.  Normal thyroid.  SKIN: Skin is warm and dry. No rash noted. Not diaphoretic. No erythema. No pallor. MUSCULOSKELETAL: Normal range of motion. No tenderness.  No cyanosis, clubbing, or edema.  2+ distal pulses. NEUROLOGIC: Alert and oriented to person, place, and time. Normal reflexes, muscle tone coordination.  PSYCHIATRIC: Normal mood and affect. Normal behavior. Normal judgment and thought content. CARDIOVASCULAR: Normal heart rate noted, regular rhythm RESPIRATORY: Clear to auscultation bilaterally. Effort and breath sounds normal, no problems with respiration noted. BREASTS: Symmetric in size. No masses, tenderness, skin changes, nipple drainage, or lymphadenopathy bilaterally. Performed in the presence of a chaperone. ABDOMEN: Soft, no  distention noted.  No tenderness, rebound or guarding.  PELVIC: Normal appearing external genitalia and urethral meatus; normal appearing vaginal mucosa and cervix.  No abnormal discharge noted.  Pap smear obtained.  Normal uterine size, no other palpable masses, no uterine or adnexal tenderness.  Performed in the presence of a chaperone.   Assessment and Plan:    1. Women's annual routine gynecological examination Normal annuale exam - Cytology - PAP  2. Routine screening for STI (sexually transmitted infection) Per pt request - Cervicovaginal ancillary only( [place])  3. Encounter for removal and reinsertion of Nexplanon See sepaprate note - etonogestrel (NEXPLANON) implant 68 mg - POCT urine pregnancy  Will follow up results of pap smear and manage accordingly. Routine preventative health maintenance measures emphasized. Please refer to After Visit Summary for other counseling recommendations.      Mariel Aloe, MD, FACOG Obstetrician & Gynecologist, [facility] for [facility], [facility]

## 2022-04-25 LAB — CERVICOVAGINAL ANCILLARY ONLY
Chlamydia: NEGATIVE
Comment: NEGATIVE
Comment: NEGATIVE
Comment: NORMAL
Neisseria Gonorrhea: NEGATIVE
Trichomonas: NEGATIVE

## 2022-04-29 LAB — CYTOLOGY - PAP
Chlamydia: NEGATIVE
Comment: NEGATIVE
Comment: NORMAL
Diagnosis: NEGATIVE
Neisseria Gonorrhea: NEGATIVE

## 2022-05-01 ENCOUNTER — Ambulatory Visit: Payer: PRIVATE HEALTH INSURANCE | Admitting: Obstetrics

## 2022-05-07 ENCOUNTER — Ambulatory Visit: Payer: PRIVATE HEALTH INSURANCE | Admitting: Obstetrics

## 2022-07-23 ENCOUNTER — Ambulatory Visit: Payer: PRIVATE HEALTH INSURANCE

## 2022-07-24 ENCOUNTER — Ambulatory Visit: Payer: PRIVATE HEALTH INSURANCE

## 2022-08-02 ENCOUNTER — Ambulatory Visit (INDEPENDENT_AMBULATORY_CARE_PROVIDER_SITE_OTHER): Payer: Self-pay | Admitting: General Practice

## 2022-08-02 ENCOUNTER — Other Ambulatory Visit (HOSPITAL_COMMUNITY)
Admission: RE | Admit: 2022-08-02 | Discharge: 2022-08-02 | Disposition: A | Discharge status: Home / Self Care | Payer: Self-pay | Source: Ambulatory Visit | Attending: Obstetrics and Gynecology | Admitting: Obstetrics and Gynecology

## 2022-08-02 VITALS — BP 136/76 | HR 80 | Ht 59.0 in | Wt 147.2 lb

## 2022-08-02 DIAGNOSIS — Z113 Encounter for screening for infections with a predominantly sexual mode of transmission: Secondary | ICD-10-CM

## 2022-08-02 NOTE — Progress Notes (Signed)
SUBJECTIVE:  26 y.o. female presents for STD testing. Pt denies symptoms.  Denies abnormal vaginal bleeding or significant pelvic pain or fever. No UTI symptoms. Denies history of known exposure to STD.  No LMP recorded. Patient has had an implant.  OBJECTIVE:  She appears well, afebrile. Urine dipstick: not done.  ASSESSMENT:  Vaginal Discharge  Vaginal Odor   PLAN:  GC, chlamydia, trichomonas, BVAG, CVAG probe sent to lab. Treatment: To be determined once lab results are received ROV prn if symptoms persist or worsen.

## 2022-08-03 LAB — HEPATITIS B SURFACE ANTIGEN: Hepatitis B Surface Ag: NEGATIVE

## 2022-08-03 LAB — RPR: RPR Ser Ql: NONREACTIVE

## 2022-08-03 LAB — HEPATITIS C ANTIBODY: Hep C Virus Ab: NONREACTIVE

## 2022-08-03 LAB — HIV ANTIBODY (ROUTINE TESTING W REFLEX): HIV Screen 4th Generation wRfx: NONREACTIVE

## 2022-08-05 LAB — CERVICOVAGINAL ANCILLARY ONLY
Bacterial Vaginitis (gardnerella): POSITIVE — AB
Candida Glabrata: NEGATIVE
Candida Vaginitis: NEGATIVE
Chlamydia: NEGATIVE
Comment: NEGATIVE
Comment: NEGATIVE
Comment: NEGATIVE
Comment: NEGATIVE
Comment: NEGATIVE
Comment: NORMAL
Neisseria Gonorrhea: NEGATIVE
Trichomonas: NEGATIVE

## 2022-08-08 ENCOUNTER — Other Ambulatory Visit: Payer: Self-pay | Admitting: *Deleted

## 2022-08-08 DIAGNOSIS — B9689 Other specified bacterial agents as the cause of diseases classified elsewhere: Secondary | ICD-10-CM

## 2022-08-08 MED ORDER — METRONIDAZOLE 500 MG PO TABS: 500.0000 mg | ORAL_TABLET | Freq: Two times a day (BID) | ORAL | 0 refills | Status: DC

## 2022-08-08 NOTE — Progress Notes (Signed)
RX Flagyl. Pt reports symptomatic of BV noted on recent vaginal swab.

## 2022-08-08 NOTE — Progress Notes (Signed)
Pt notified. Reports symptomatic. RX Flagyl sent. Pt education sent via [place].

## 2022-11-04 ENCOUNTER — Other Ambulatory Visit: Payer: Self-pay

## 2022-11-04 ENCOUNTER — Emergency Department (HOSPITAL_COMMUNITY)
Admission: EM | Admit: 2022-11-04 | Discharge: 2022-11-04 | Disposition: A | Discharge status: Home / Self Care | Payer: Self-pay | Attending: Emergency Medicine | Admitting: Emergency Medicine

## 2022-11-04 ENCOUNTER — Encounter (HOSPITAL_COMMUNITY): Payer: Self-pay

## 2022-11-04 DIAGNOSIS — K029 Dental caries, unspecified: Secondary | ICD-10-CM | POA: Insufficient documentation

## 2022-11-04 MED ORDER — PENICILLIN V POTASSIUM 500 MG PO TABS
500.0000 mg | ORAL_TABLET | Freq: Four times a day (QID) | ORAL | 0 refills | Status: AC
Start: 2022-11-04 — End: 2022-11-14

## 2022-11-04 NOTE — ED Provider Notes (Signed)
[facility] Provider Note   CSN: [id number] Arrival date & time: 11/04/22  0932     History  Chief Complaint  Patient presents with   Dental Pain    Elizabeth Coleman is a 27 y.o. female.  27 year old otherwise healthy female presents with complaint of right upper dental pain, ongoing problem for a while, unable to follow-up with a dentist due to cost.  Denies trauma, fever, drainage.  Denies possibility of pregnancy.  No other complaints or concerns today.       Home Medications Prior to Admission medications   Medication Sig Start Date End Date Taking? Authorizing Provider  penicillin v potassium (VEETID) 500 MG tablet Take 1 tablet (500 mg total) by mouth 4 (four) times daily for 10 days. 11/04/22 11/14/22 Yes Tacy Learn, PA-C  benzonatate (TESSALON) 100 MG capsule Take 1-2 capsules (100-200 mg total) by mouth 3 (three) times daily as needed for cough. Patient not taking: Reported on 08/02/2022 08/30/21   Jaynee Eagles, PA-C  cetirizine (ZYRTEC ALLERGY) 10 MG tablet Take 1 tablet (10 mg total) by mouth daily. Patient not taking: Reported on 08/02/2022 08/30/21   Jaynee Eagles, PA-C  fluticasone [facility]) 50 MCG/ACT nasal spray Place 2 sprays into both nostrils daily. Patient not taking: Reported on 08/02/2022 08/30/21   Jaynee Eagles, PA-C  ibuprofen (ADVIL) 800 MG tablet Take 1 tablet (800 mg total) by mouth every 8 (eight) hours as needed. Patient not taking: Reported on 08/02/2022 03/19/22   Shelly Bombard, MD  metroNIDAZOLE (FLAGYL) 500 MG tablet Take 1 tablet (500 mg total) by mouth 2 (two) times daily. Patient not taking: Reported on 08/02/2022 03/08/21   Constant, Peggy, MD  metroNIDAZOLE (FLAGYL) 500 MG tablet Take 1 tablet (500 mg total) by mouth 2 (two) times daily. Patient not taking: Reported on 08/02/2022 03/21/22   Chancy Milroy, MD  metroNIDAZOLE (FLAGYL) 500 MG tablet Take 1 tablet (500 mg total) by mouth 2 (two) times  daily. 08/08/22   Darliss Cheney, MD  oseltamivir (TAMIFLU) 75 MG capsule Take 1 capsule (75 mg total) by mouth 2 (two) times daily. Patient not taking: Reported on 08/02/2022 08/30/21   Jaynee Eagles, PA-C  promethazine-dextromethorphan (PROMETHAZINE-DM) 6.25-15 MG/5ML syrup Take 5 mLs by mouth at bedtime as needed for cough. Patient not taking: Reported on 08/02/2022 08/30/21   Jaynee Eagles, PA-C  pseudoephedrine (SUDAFED) 30 MG tablet Take 1 tablet (30 mg total) by mouth every 8 (eight) hours as needed for congestion. Patient not taking: Reported on 08/02/2022 08/30/21   Jaynee Eagles, PA-C      Allergies    Patient has no known allergies.    Review of Systems   Review of Systems Negative except as per HPI Physical Exam Updated Vital Signs BP 130/85   Pulse 81   Temp 98.5 F (36.9 C) (Oral)   Resp (!) 22   Ht 4\' 11"  (1.499 m)   Wt 66.7 kg   SpO2 100%   BMI 29.69 kg/m  Physical Exam Vitals and nursing note reviewed.  Constitutional:      General: She is not in acute distress.    Appearance: She is well-developed. She is not diaphoretic.  HENT:     Head: Normocephalic and atraumatic.     Jaw: No trismus.     Mouth/Throat:     Mouth: Mucous membranes are moist.      Comments: Wisdom teeth present, cavity right upper molar vs emerging molar  Pulmonary:     Effort: Pulmonary effort is normal.  Musculoskeletal:     Cervical back: Neck supple. No tenderness.  Lymphadenopathy:     Cervical: No cervical adenopathy.  Skin:    General: Skin is warm and dry.     Findings: No erythema or rash.  Neurological:     Mental Status: She is alert and oriented to person, place, and time.  Psychiatric:        Behavior: Behavior normal.     ED Results / Procedures / Treatments   Labs (all labs ordered are listed, but only abnormal results are displayed) Labs Reviewed - No data to display  EKG None  Radiology No results found.  Procedures Procedures    Medications  Ordered in ED Medications - No data to display  ED Course/ Medical Decision Making/ A&P                             Medical Decision Making Risk Prescription drug management.   27 year old female with right upper dental pain, question dental caries versus emerging wisdom tooth.  Advised to follow-up with dentist, will treat with antibiotics.  Recommend Motrin and Tylenol for pain.        Final Clinical Impression(s) / ED Diagnoses Final diagnoses:  Dental caries    Rx / DC Orders ED Discharge Orders          Ordered    penicillin v potassium (VEETID) 500 MG tablet  4 times daily        11/04/22 0730              Tacy Learn, PA-C 11/04/22 0736    Fransico Meadow, MD 11/04/22 [phone]

## 2022-11-04 NOTE — Discharge Instructions (Addendum)
See resource guide, referrals, consider affordable dentures.  Antibiotics as prescribed. Rinse with Listerine after every meal. Advil liquid gel 600mg  with Tylenol 650mg  every 6 hours as needed for pain.

## 2022-11-04 NOTE — ED Triage Notes (Signed)
Was seen here before for similar symptoms. Complains of left upper molar pain.  Reports has gone to the dentist but can't afford what they are charging to get the help.

## 2023-03-07 ENCOUNTER — Ambulatory Visit: Payer: Self-pay

## 2023-03-18 ENCOUNTER — Other Ambulatory Visit (HOSPITAL_COMMUNITY)
Admission: RE | Admit: 2023-03-18 | Discharge: 2023-03-18 | Disposition: A | Discharge status: Home / Self Care | Payer: Self-pay | Source: Ambulatory Visit | Attending: Obstetrics and Gynecology | Admitting: Obstetrics and Gynecology

## 2023-03-18 ENCOUNTER — Ambulatory Visit (INDEPENDENT_AMBULATORY_CARE_PROVIDER_SITE_OTHER): Payer: Self-pay

## 2023-03-18 DIAGNOSIS — Z113 Encounter for screening for infections with a predominantly sexual mode of transmission: Secondary | ICD-10-CM | POA: Insufficient documentation

## 2023-03-18 NOTE — Progress Notes (Signed)
Patient presents for STD screening. Patient denies having any symptoms at this time. Patient states that she has a new partner. No other concerns.  Self swab completed.  Patient advised to allow 24-48 hours for results to return .

## 2023-03-19 LAB — CERVICOVAGINAL ANCILLARY ONLY
Chlamydia: NEGATIVE
Comment: NEGATIVE
Comment: NEGATIVE
Comment: NORMAL
Neisseria Gonorrhea: NEGATIVE
Trichomonas: NEGATIVE

## 2023-03-19 LAB — HEPATITIS C ANTIBODY: Hep C Virus Ab: NONREACTIVE

## 2023-03-19 LAB — RPR: RPR Ser Ql: NONREACTIVE

## 2023-03-19 LAB — HEPATITIS B SURFACE ANTIGEN: Hepatitis B Surface Ag: NEGATIVE

## 2023-03-19 LAB — HIV ANTIBODY (ROUTINE TESTING W REFLEX): HIV Screen 4th Generation wRfx: NONREACTIVE

## 2023-04-21 ENCOUNTER — Ambulatory Visit: Payer: Self-pay | Admitting: Obstetrics and Gynecology

## 2023-05-29 ENCOUNTER — Ambulatory Visit: Payer: Self-pay | Admitting: Obstetrics and Gynecology

## 2023-06-27 ENCOUNTER — Ambulatory Visit: Payer: Self-pay | Admitting: Obstetrics & Gynecology

## 2023-07-18 ENCOUNTER — Other Ambulatory Visit (HOSPITAL_COMMUNITY)
Admission: RE | Admit: 2023-07-18 | Discharge: 2023-07-18 | Disposition: A | Discharge status: Home / Self Care | Payer: BC Managed Care – PPO | Source: Ambulatory Visit | Attending: Obstetrics and Gynecology | Admitting: Obstetrics and Gynecology

## 2023-07-18 ENCOUNTER — Encounter: Payer: Self-pay | Admitting: Obstetrics and Gynecology

## 2023-07-18 ENCOUNTER — Ambulatory Visit: Payer: BC Managed Care – PPO | Admitting: Obstetrics and Gynecology

## 2023-07-18 VITALS — BP 110/74 | HR 77 | Wt 143.8 lb

## 2023-07-18 DIAGNOSIS — Z01419 Encounter for gynecological examination (general) (routine) without abnormal findings: Secondary | ICD-10-CM

## 2023-07-18 NOTE — Progress Notes (Signed)
Pt is in the office for annual Last pap 04/24/2022 Currently has nexplanon Pt desires std testing

## 2023-07-18 NOTE — Progress Notes (Signed)
Subjective:     Elizabeth Coleman is a 27 y.o. female P0 with BMI 29 who is here for a comprehensive physical exam. The patient reports irregular bleeding with nexplanon which is tolerable. She denies pelvic pain or abnormal discharge. She is sexually active without concerns. Patient desires STI screening. She denies any urinary complaints  Past Medical History:  Diagnosis Date   Chlamydia    Trichomonal infection    History reviewed. No pertinent surgical history. Family History  Problem Relation Age of Onset   Heart disease Maternal Grandmother    Cancer Maternal Grandfather    Hypertension Other    Diabetes Other    Cancer Other     Social History   Socioeconomic History   Marital status: Single    Spouse name: Not on file   Number of children: Not on file   Years of education: Not on file   Highest education level: Not on file  Occupational History   Not on file  Tobacco Use   Smoking status: Light Smoker    Current packs/day: 0.25    Types: Cigarettes   Smokeless tobacco: Never   Tobacco comments:    trying to stop   Vaping Use   Vaping status: Never Used  Substance and Sexual Activity   Alcohol use: Yes    Alcohol/week: 0.0 standard drinks of alcohol    Comment: occ   Drug use: No    Frequency: 1.0 times per week    Types: Marijuana   Sexual activity: Yes    Partners: Male    Birth control/protection: Implant  Other Topics Concern   Not on file  Social History Narrative   Not on file   Social Determinants of Health   Financial Resource Strain: Not on file  Food Insecurity: Not on file  Transportation Needs: Not on file  Physical Activity: Not on file  Stress: Not on file  Social Connections: Not on file  Intimate Partner Violence: Not on file   Health Maintenance  Topic Date Due   HPV VACCINES (1 - 3-dose series) Never done   INFLUENZA VACCINE  Never done   COVID-19 Vaccine (1 - 2023-24 season) Never done   Cervical Cancer Screening (Pap smear)   04/24/2025   DTaP/Tdap/Td (3 - Td or Tdap) 04/04/2028   Hepatitis C Screening  Completed   HIV Screening  Completed       Review of Systems Pertinent items noted in HPI and remainder of comprehensive ROS otherwise negative.   Objective:  Blood pressure 110/74, pulse 77, weight 143 lb 12.8 oz (65.2 kg).   GENERAL: Well-developed, well-nourished female in no acute distress.  HEENT: Normocephalic, atraumatic. Sclerae anicteric.  NECK: Supple. Normal thyroid.  LUNGS: Clear to auscultation bilaterally.  HEART: Regular rate and rhythm. BREASTS: Symmetric in size. No palpable masses or lymphadenopathy, skin changes, or nipple drainage. ABDOMEN: Soft, nontender, nondistended. No organomegaly. PELVIC: declined. Self swab EXTREMITIES: No cyanosis, clubbing, or edema, 2+ distal pulses.     Assessment:    Healthy female exam.      Plan:    STI screening per patient request Patient with normal pap smear 2023 Patient will be contacted with abnormal results See After Visit Summary for Counseling Recommendations

## 2023-07-19 LAB — HIV ANTIBODY (ROUTINE TESTING W REFLEX): HIV Screen 4th Generation wRfx: NONREACTIVE

## 2023-07-19 LAB — HEPATITIS B SURFACE ANTIGEN: Hepatitis B Surface Ag: NEGATIVE

## 2023-07-19 LAB — RPR: RPR Ser Ql: NONREACTIVE

## 2023-07-19 LAB — HEPATITIS C ANTIBODY: Hep C Virus Ab: NONREACTIVE

## 2023-07-21 LAB — CERVICOVAGINAL ANCILLARY ONLY
Bacterial Vaginitis (gardnerella): POSITIVE — AB
Candida Glabrata: NEGATIVE
Candida Vaginitis: NEGATIVE
Chlamydia: NEGATIVE
Comment: NEGATIVE
Comment: NEGATIVE
Comment: NEGATIVE
Comment: NEGATIVE
Comment: NEGATIVE
Comment: NORMAL
Neisseria Gonorrhea: NEGATIVE
Trichomonas: NEGATIVE

## 2023-07-21 MED ORDER — METRONIDAZOLE 500 MG PO TABS: 500.0000 mg | ORAL_TABLET | Freq: Two times a day (BID) | ORAL | 0 refills | Status: DC

## 2023-07-21 NOTE — Addendum Note (Signed)
Addended by: Catalina Antigua on: 07/21/2023 03:33 PM   Modules accepted: Orders

## 2023-11-13 ENCOUNTER — Ambulatory Visit: Payer: BC Managed Care – PPO | Admitting: General Practice

## 2023-11-13 ENCOUNTER — Other Ambulatory Visit (HOSPITAL_COMMUNITY)
Admission: RE | Admit: 2023-11-13 | Discharge: 2023-11-13 | Disposition: A | Discharge status: Home / Self Care | Payer: BC Managed Care – PPO | Source: Ambulatory Visit | Attending: Obstetrics and Gynecology | Admitting: Obstetrics and Gynecology

## 2023-11-13 VITALS — BP 132/81 | HR 92 | Wt 140.5 lb

## 2023-11-13 DIAGNOSIS — Z113 Encounter for screening for infections with a predominantly sexual mode of transmission: Secondary | ICD-10-CM

## 2023-11-13 DIAGNOSIS — B1910 Unspecified viral hepatitis B without hepatic coma: Secondary | ICD-10-CM

## 2023-11-13 NOTE — Progress Notes (Signed)
SUBJECTIVE:  28 y.o. female presents for STD screening. Denies abnormal vaginal bleeding or significant pelvic pain or fever. No UTI symptoms. Denies history of known exposure to STD.  No LMP recorded. Patient has had an implant.  OBJECTIVE:  She appears well, afebrile. Urine dipstick: not done.  ASSESSMENT:  Vaginal Discharge  Vaginal Odor   PLAN:  GC, chlamydia, trichomonas, BVAG, CVAG probe sent to lab. Treatment: To be determined once lab results are received ROV prn if symptoms persist or worsen.

## 2023-11-14 ENCOUNTER — Encounter: Payer: Self-pay | Admitting: Obstetrics and Gynecology

## 2023-11-14 LAB — CERVICOVAGINAL ANCILLARY ONLY
Chlamydia: NEGATIVE
Comment: NEGATIVE
Comment: NEGATIVE
Comment: NORMAL
Neisseria Gonorrhea: NEGATIVE
Trichomonas: NEGATIVE

## 2023-11-14 LAB — HEPATITIS B SURFACE AG, CONFIRM: HBsAG Confirmation: POSITIVE — AB

## 2023-11-14 LAB — HEPATITIS C ANTIBODY: Hep C Virus Ab: NONREACTIVE

## 2023-11-14 LAB — RPR: RPR Ser Ql: NONREACTIVE

## 2023-11-14 LAB — HEPATITIS B SURFACE ANTIGEN

## 2023-11-14 LAB — HIV ANTIBODY (ROUTINE TESTING W REFLEX): HIV Screen 4th Generation wRfx: NONREACTIVE

## 2023-11-14 NOTE — Addendum Note (Signed)
Addended by: Harvie Bridge on: 11/14/2023 03:42 PM   Modules accepted: Orders

## 2024-01-28 ENCOUNTER — Other Ambulatory Visit (HOSPITAL_COMMUNITY)
Admission: RE | Admit: 2024-01-28 | Discharge: 2024-01-28 | Disposition: A | Discharge status: Home / Self Care | Source: Ambulatory Visit | Attending: Obstetrics and Gynecology | Admitting: Obstetrics and Gynecology

## 2024-01-28 ENCOUNTER — Ambulatory Visit

## 2024-01-28 VITALS — BP 112/78 | HR 86

## 2024-01-28 DIAGNOSIS — Z113 Encounter for screening for infections with a predominantly sexual mode of transmission: Secondary | ICD-10-CM | POA: Diagnosis not present

## 2024-01-28 NOTE — Progress Notes (Signed)
 SUBJECTIVE:  28 y.o. female who desires a STI screen. Denies abnormal vaginal discharge, bleeding or significant pelvic pain. No UTI symptoms. Denies history of known exposure to STD.  No LMP recorded. Patient has had an implant.  OBJECTIVE:  She appears well.   ASSESSMENT:  STI Screen   PLAN:  Pt offered STI blood screening-requested GC, chlamydia, and trichomonas probe sent to lab.  Treatment: To be determined once lab results are received.  Pt follow up as needed.

## 2024-01-29 ENCOUNTER — Encounter: Payer: Self-pay | Admitting: Obstetrics and Gynecology

## 2024-01-29 LAB — HEPATITIS C ANTIBODY: Hep C Virus Ab: NONREACTIVE

## 2024-01-29 LAB — RPR: RPR Ser Ql: NONREACTIVE

## 2024-01-29 LAB — HIV ANTIBODY (ROUTINE TESTING W REFLEX): HIV Screen 4th Generation wRfx: NONREACTIVE

## 2024-01-29 LAB — CERVICOVAGINAL ANCILLARY ONLY
Bacterial Vaginitis (gardnerella): POSITIVE — AB
Candida Glabrata: NEGATIVE
Candida Vaginitis: NEGATIVE
Chlamydia: NEGATIVE
Comment: NEGATIVE
Comment: NEGATIVE
Comment: NEGATIVE
Comment: NEGATIVE
Comment: NEGATIVE
Comment: NORMAL
Neisseria Gonorrhea: NEGATIVE
Trichomonas: NEGATIVE

## 2024-01-29 LAB — HEPATITIS B SURFACE ANTIGEN: Hepatitis B Surface Ag: NEGATIVE

## 2024-02-02 ENCOUNTER — Other Ambulatory Visit: Payer: Self-pay

## 2024-02-02 MED ORDER — METRONIDAZOLE 500 MG PO TABS
500.0000 mg | ORAL_TABLET | Freq: Two times a day (BID) | ORAL | 0 refills | Status: AC
Start: 2024-02-02 — End: ?

## 2024-07-29 ENCOUNTER — Other Ambulatory Visit (HOSPITAL_COMMUNITY)
Admission: RE | Admit: 2024-07-29 | Discharge: 2024-07-29 | Disposition: A | Discharge status: Home / Self Care | Source: Ambulatory Visit | Attending: Obstetrics and Gynecology | Admitting: Obstetrics and Gynecology

## 2024-07-29 ENCOUNTER — Ambulatory Visit (INDEPENDENT_AMBULATORY_CARE_PROVIDER_SITE_OTHER)

## 2024-07-29 VITALS — BP 135/71 | HR 89

## 2024-07-29 DIAGNOSIS — Z113 Encounter for screening for infections with a predominantly sexual mode of transmission: Secondary | ICD-10-CM | POA: Insufficient documentation

## 2024-07-29 LAB — CERVICOVAGINAL ANCILLARY ONLY
Bacterial Vaginitis (gardnerella): POSITIVE — AB
Candida Glabrata: NEGATIVE
Candida Vaginitis: NEGATIVE
Chlamydia: NEGATIVE
Comment: NEGATIVE
Comment: NEGATIVE
Comment: NEGATIVE
Comment: NEGATIVE
Comment: NEGATIVE
Comment: NORMAL
Neisseria Gonorrhea: NEGATIVE
Trichomonas: NEGATIVE

## 2024-07-29 NOTE — Progress Notes (Signed)
 SUBJECTIVE:  28 y.o. female who desires a STI screen. Denies abnormal vaginal discharge, bleeding or significant pelvic pain. No UTI symptoms. Denies history of known exposure to STD.  No LMP recorded. Patient has had an implant.  OBJECTIVE:  She appears well.   ASSESSMENT:  STI Screen   PLAN:  Pt offered STI blood screening-requested GC, chlamydia, and trichomonas probe sent to lab.  Treatment: To be determined once lab results are received.  Pt follow up as needed.

## 2024-07-30 LAB — HEPATITIS C ANTIBODY: Hep C Virus Ab: NONREACTIVE

## 2024-07-30 LAB — HIV ANTIBODY (ROUTINE TESTING W REFLEX): HIV Screen 4th Generation wRfx: NONREACTIVE

## 2024-07-30 LAB — RPR: RPR Ser Ql: NONREACTIVE

## 2024-07-30 LAB — HEPATITIS B SURFACE ANTIGEN: Hepatitis B Surface Ag: NEGATIVE

## 2024-08-04 ENCOUNTER — Ambulatory Visit: Payer: Self-pay | Admitting: Obstetrics and Gynecology

## 2024-08-04 MED ORDER — METRONIDAZOLE 500 MG PO TABS: 500.0000 mg | ORAL_TABLET | Freq: Two times a day (BID) | ORAL | 0 refills | Status: DC

## 2024-08-11 ENCOUNTER — Ambulatory Visit: Admitting: Obstetrics and Gynecology

## 2024-08-16 ENCOUNTER — Other Ambulatory Visit (HOSPITAL_COMMUNITY)
Admission: RE | Admit: 2024-08-16 | Discharge: 2024-08-16 | Disposition: A | Discharge status: Home / Self Care | Source: Ambulatory Visit | Attending: Obstetrics | Admitting: Obstetrics

## 2024-08-16 ENCOUNTER — Ambulatory Visit (INDEPENDENT_AMBULATORY_CARE_PROVIDER_SITE_OTHER): Admitting: *Deleted

## 2024-08-16 DIAGNOSIS — Z113 Encounter for screening for infections with a predominantly sexual mode of transmission: Secondary | ICD-10-CM | POA: Diagnosis not present

## 2024-08-16 DIAGNOSIS — N898 Other specified noninflammatory disorders of vagina: Secondary | ICD-10-CM | POA: Diagnosis not present

## 2024-08-16 NOTE — Progress Notes (Signed)
 SUBJECTIVE:  28 y.o. female complains of vaginal discharge. Denies abnormal vaginal bleeding or significant pelvic pain or fever. No UTI symptoms. Pt has had a new sexual partner and would like screening.  Denies history of known exposure to STD.  No LMP recorded. Patient has had an implant.  OBJECTIVE:  She appears well, afebrile. Urine dipstick: not done.  ASSESSMENT:  Vaginal Discharge    PLAN:  GC, chlamydia, trichomonas, BVAG, CVAG probe sent to lab. Treatment: To be determined once lab results are received ROV prn if symptoms persist or worsen.

## 2024-08-17 LAB — HIV ANTIBODY (ROUTINE TESTING W REFLEX): HIV Screen 4th Generation wRfx: NONREACTIVE

## 2024-08-17 LAB — CERVICOVAGINAL ANCILLARY ONLY
Bacterial Vaginitis (gardnerella): POSITIVE — AB
Candida Glabrata: NEGATIVE
Candida Vaginitis: POSITIVE — AB
Chlamydia: POSITIVE — AB
Comment: NEGATIVE
Comment: NEGATIVE
Comment: NEGATIVE
Comment: NEGATIVE
Comment: NEGATIVE
Comment: NORMAL
Neisseria Gonorrhea: NEGATIVE
Trichomonas: NEGATIVE

## 2024-08-17 LAB — RPR: RPR Ser Ql: NONREACTIVE

## 2024-08-17 LAB — HEPATITIS C ANTIBODY: Hep C Virus Ab: NONREACTIVE

## 2024-08-17 LAB — HEPATITIS B SURFACE ANTIGEN: Hepatitis B Surface Ag: NEGATIVE

## 2024-08-18 ENCOUNTER — Other Ambulatory Visit: Payer: Self-pay

## 2024-08-18 MED ORDER — METRONIDAZOLE 500 MG PO TABS: 500.0000 mg | ORAL_TABLET | Freq: Two times a day (BID) | ORAL | 0 refills | Status: AC

## 2024-08-18 MED ORDER — DOXYCYCLINE HYCLATE 100 MG PO CAPS: 100.0000 mg | ORAL_CAPSULE | Freq: Two times a day (BID) | ORAL | 0 refills | Status: AC

## 2024-08-18 MED ORDER — FLUCONAZOLE 150 MG PO TABS: 150.0000 mg | ORAL_TABLET | Freq: Once | ORAL | 0 refills | Status: AC

## 2024-08-20 ENCOUNTER — Ambulatory Visit: Payer: Self-pay | Admitting: Obstetrics and Gynecology

## 2024-08-20 MED ORDER — FLUCONAZOLE 150 MG PO TABS: 150.0000 mg | ORAL_TABLET | Freq: Once | ORAL | 0 refills | Status: AC
